# Patient Record
Sex: Female | Born: 1960 | Race: Black or African American | Hispanic: No | Marital: Single | Smoking: Current every day smoker
Health system: Southern US, Community
[De-identification: ages and names within clinical notes are randomized; demographics above are authoritative.]

## PROBLEM LIST (undated history)

## (undated) DIAGNOSIS — M549 Dorsalgia, unspecified: Secondary | ICD-10-CM

## (undated) DIAGNOSIS — G43909 Migraine, unspecified, not intractable, without status migrainosus: Secondary | ICD-10-CM

## (undated) HISTORY — PX: BACK SURGERY: SHX140

---

## 2008-08-07 ENCOUNTER — Encounter: Admission: RE | Admit: 2008-08-07 | Discharge: 2008-08-07 | Payer: Self-pay | Admitting: Obstetrics and Gynecology

## 2008-08-09 ENCOUNTER — Encounter: Admission: RE | Admit: 2008-08-09 | Discharge: 2008-08-09 | Payer: Self-pay | Admitting: Obstetrics and Gynecology

## 2008-08-29 ENCOUNTER — Encounter: Admission: RE | Admit: 2008-08-29 | Discharge: 2008-08-29 | Payer: Self-pay | Admitting: Obstetrics and Gynecology

## 2009-09-20 ENCOUNTER — Emergency Department (HOSPITAL_BASED_OUTPATIENT_CLINIC_OR_DEPARTMENT_OTHER): Admission: EM | Admit: 2009-09-20 | Discharge: 2009-09-20 | Payer: Self-pay | Admitting: Emergency Medicine

## 2009-10-18 ENCOUNTER — Ambulatory Visit: Payer: Self-pay | Admitting: Diagnostic Radiology

## 2009-10-18 ENCOUNTER — Emergency Department (HOSPITAL_BASED_OUTPATIENT_CLINIC_OR_DEPARTMENT_OTHER): Admission: EM | Admit: 2009-10-18 | Discharge: 2009-10-18 | Payer: Self-pay | Admitting: Emergency Medicine

## 2010-04-27 ENCOUNTER — Encounter: Payer: Self-pay | Admitting: Internal Medicine

## 2010-06-21 LAB — CBC
HCT: 38 % (ref 36.0–46.0)
Hemoglobin: 12.7 g/dL (ref 12.0–15.0)
MCH: 33.1 pg (ref 26.0–34.0)
MCHC: 33.5 g/dL (ref 30.0–36.0)
MCV: 98.9 fL (ref 78.0–100.0)
Platelets: 273 10*3/uL (ref 150–400)
RBC: 3.84 MIL/uL — ABNORMAL LOW (ref 3.87–5.11)
RDW: 12.6 % (ref 11.5–15.5)
WBC: 5.1 10*3/uL (ref 4.0–10.5)

## 2010-06-21 LAB — LIPASE, BLOOD: Lipase: 90 U/L (ref 23–300)

## 2010-06-21 LAB — WET PREP, GENITAL
Trich, Wet Prep: NONE SEEN
Yeast Wet Prep HPF POC: NONE SEEN

## 2010-06-21 LAB — COMPREHENSIVE METABOLIC PANEL WITH GFR
ALT: 22 U/L (ref 0–35)
AST: 21 U/L (ref 0–37)
Alkaline Phosphatase: 73 U/L (ref 39–117)
CO2: 25 meq/L (ref 19–32)
Chloride: 107 meq/L (ref 96–112)
Creatinine, Ser: 0.7 mg/dL (ref 0.4–1.2)
GFR calc Af Amer: >60 mL/min (ref >60)
GFR calc non Af Amer: >60 mL/min (ref >60)
Potassium: 4 meq/L (ref 3.5–5.1)
Total Bilirubin: 1 mg/dL (ref 0.3–1.2)

## 2010-06-21 LAB — URINALYSIS, ROUTINE W REFLEX MICROSCOPIC
Bilirubin Urine: NEGATIVE
Glucose, UA: NEGATIVE mg/dL
Hgb urine dipstick: NEGATIVE
Ketones, ur: NEGATIVE mg/dL
Nitrite: NEGATIVE
Protein, ur: NEGATIVE mg/dL
Specific Gravity, Urine: 1.029 (ref 1.005–1.030)
Urobilinogen, UA: 0.2 mg/dL (ref 0.0–1.0)
pH: 6.5 (ref 5.0–8.0)

## 2010-06-21 LAB — COMPREHENSIVE METABOLIC PANEL
Albumin: 4.5 g/dL (ref 3.5–5.2)
BUN: 14 mg/dL (ref 6–23)
Calcium: 9.6 mg/dL (ref 8.4–10.5)
Glucose, Bld: 81 mg/dL (ref 70–99)
Sodium: 143 mEq/L (ref 135–145)
Total Protein: 8.3 g/dL (ref 6.0–8.3)

## 2010-06-21 LAB — DIFFERENTIAL
Basophils Absolute: 0.1 K/uL (ref 0.0–0.1)
Basophils Relative: 2 % — ABNORMAL HIGH (ref 0–1)
Eosinophils Absolute: 0 K/uL (ref 0.0–0.7)
Eosinophils Relative: 1 % (ref 0–5)
Lymphocytes Relative: 36 % (ref 12–46)
Lymphs Abs: 1.8 10*3/uL (ref 0.7–4.0)
Monocytes Absolute: 0.4 10*3/uL (ref 0.1–1.0)
Monocytes Relative: 9 % (ref 3–12)
Neutro Abs: 2.8 10*3/uL (ref 1.7–7.7)
Neutrophils Relative %: 53 % (ref 43–77)

## 2010-06-21 LAB — PREGNANCY, URINE: Preg Test, Ur: NEGATIVE

## 2010-06-21 LAB — GC/CHLAMYDIA PROBE AMP, GENITAL
Chlamydia, DNA Probe: NEGATIVE
GC Probe Amp, Genital: NEGATIVE

## 2010-06-21 LAB — RPR: RPR Ser Ql: NONREACTIVE

## 2010-08-14 ENCOUNTER — Emergency Department (HOSPITAL_BASED_OUTPATIENT_CLINIC_OR_DEPARTMENT_OTHER)
Admission: EM | Admit: 2010-08-14 | Discharge: 2010-08-14 | Disposition: A | Discharge status: Home / Self Care | Payer: 59 | Attending: Emergency Medicine | Admitting: Emergency Medicine

## 2010-08-14 DIAGNOSIS — S335XXA Sprain of ligaments of lumbar spine, initial encounter: Secondary | ICD-10-CM | POA: Insufficient documentation

## 2010-08-14 DIAGNOSIS — X500XXA Overexertion from strenuous movement or load, initial encounter: Secondary | ICD-10-CM | POA: Insufficient documentation

## 2011-05-04 ENCOUNTER — Other Ambulatory Visit: Payer: Self-pay | Admitting: Internal Medicine

## 2011-05-04 DIAGNOSIS — Z1231 Encounter for screening mammogram for malignant neoplasm of breast: Secondary | ICD-10-CM

## 2011-05-27 ENCOUNTER — Ambulatory Visit
Admission: RE | Admit: 2011-05-27 | Discharge: 2011-05-27 | Disposition: A | Discharge status: Home / Self Care | Payer: 59 | Source: Ambulatory Visit | Attending: Internal Medicine | Admitting: Internal Medicine

## 2011-05-27 DIAGNOSIS — Z1231 Encounter for screening mammogram for malignant neoplasm of breast: Secondary | ICD-10-CM

## 2013-07-05 ENCOUNTER — Other Ambulatory Visit: Payer: Self-pay | Admitting: Family Medicine

## 2013-07-05 DIAGNOSIS — R109 Unspecified abdominal pain: Secondary | ICD-10-CM

## 2013-07-11 ENCOUNTER — Other Ambulatory Visit: Payer: 59

## 2014-10-12 ENCOUNTER — Encounter (HOSPITAL_BASED_OUTPATIENT_CLINIC_OR_DEPARTMENT_OTHER): Payer: Self-pay | Admitting: *Deleted

## 2014-10-12 ENCOUNTER — Emergency Department (HOSPITAL_BASED_OUTPATIENT_CLINIC_OR_DEPARTMENT_OTHER)
Admission: EM | Admit: 2014-10-12 | Discharge: 2014-10-12 | Disposition: A | Discharge status: Home / Self Care | Payer: Self-pay | Attending: Emergency Medicine | Admitting: Emergency Medicine

## 2014-10-12 DIAGNOSIS — M549 Dorsalgia, unspecified: Secondary | ICD-10-CM | POA: Insufficient documentation

## 2014-10-12 DIAGNOSIS — Z720 Tobacco use: Secondary | ICD-10-CM | POA: Insufficient documentation

## 2014-10-12 DIAGNOSIS — Z9889 Other specified postprocedural states: Secondary | ICD-10-CM | POA: Insufficient documentation

## 2014-10-12 DIAGNOSIS — G8929 Other chronic pain: Secondary | ICD-10-CM | POA: Insufficient documentation

## 2014-10-12 DIAGNOSIS — Z7951 Long term (current) use of inhaled steroids: Secondary | ICD-10-CM | POA: Insufficient documentation

## 2014-10-12 DIAGNOSIS — Z79899 Other long term (current) drug therapy: Secondary | ICD-10-CM | POA: Insufficient documentation

## 2014-10-12 HISTORY — DX: Dorsalgia, unspecified: M54.9

## 2014-10-12 NOTE — ED Notes (Signed)
MD at bedside. 

## 2014-10-12 NOTE — Discharge Instructions (Signed)
As we discussed unable to fill of pain medications for chronic back pain.

## 2014-10-12 NOTE — ED Notes (Signed)
Patient states she has chronic back pain and has her pain medications managed by her doctor [facility].  States she sees the doctor every two months.  Today, she found out her insurance has been cancelled and she has a large bill with her doctor's office.  States she could not afford to pay the balance today.

## 2014-10-12 NOTE — ED Provider Notes (Signed)
CSN: [id number]     Arrival date & time 10/12/14  5621 History   First MD Initiated Contact with Patient 10/12/14 [id number]     Chief Complaint  Patient presents with  . Medication Refill     (Consider location/radiation/quality/duration/timing/severity/associated sxs/prior Treatment) The history is provided by the patient.   54 year old female with a history of chronic active pain. Followed by [facility] in the past. Normally seen by them every 2 months for control of this pain. They are no longer able to follow her. Patient is here to have her chronic pain medicines refilled. No new injury or any new or worse symptoms.  Past Medical History  Diagnosis Date  . Back pain    Past Surgical History  Procedure Laterality Date  . Back surgery     No family history on file. History  Substance Use Topics  . Smoking status: Current Every Day Smoker    Types: Cigarettes  . Smokeless tobacco: Never Used  . Alcohol Use: No   OB History    No data available     Review of Systems  Constitutional: Negative for fever.  HENT: Negative for congestion.   Eyes: Negative for redness.  Respiratory: Negative for shortness of breath.   Cardiovascular: Negative for chest pain.  Gastrointestinal: Negative for nausea, vomiting and abdominal pain.  Genitourinary: Negative for dysuria.  Musculoskeletal: Positive for back pain. Negative for neck pain.  Skin: Negative for rash.  Neurological: Negative for headaches.  Hematological: Does not bruise/bleed easily.  Psychiatric/Behavioral: Negative for confusion.      Allergies  Motrin  Home Medications   Prior to Admission medications   Medication Sig Start Date End Date Taking? Authorizing Provider  fluticasone (FLOVENT DISKUS) 50 MCG/BLIST diskus inhaler Inhale 1 puff into the lungs 2 (two) times daily.   Yes Historical Provider, MD  HYDROcodone-acetaminophen (NORCO) 10-325 MG per tablet Take 1 tablet by mouth every 6 (six) hours as needed.    Yes Historical Provider, MD  Linaclotide [name]) 145 MCG CAPS capsule Take 145 mcg by mouth daily.   Yes Historical Provider, MD  zolpidem (AMBIEN) 10 MG tablet Take 10 mg by mouth at bedtime as needed for sleep.   Yes Historical Provider, MD   BP 146/73 mmHg  Pulse 88  Temp(Src) 98.9 F (37.2 C) (Oral)  Resp 18  Ht  (1.676 m)  Wt 142 lb (64.411 kg)  BMI 22.93 kg/m2  SpO2 99%  LMP 10/05/2014 (Approximate) Physical Exam  Constitutional: She is oriented to person, place, and time. She appears well-developed and well-nourished. No distress.  HENT:  Head: Normocephalic and atraumatic.  Mouth/Throat: Oropharynx is clear and moist.  Eyes: Conjunctivae and EOM are normal. Pupils are equal, round, and reactive to light.  Neck: Normal range of motion. Neck supple.  Cardiovascular: Normal rate, regular rhythm and normal heart sounds.   No murmur heard. Pulmonary/Chest: Effort normal and breath sounds normal.  Abdominal: Soft. Bowel sounds are normal. There is no tenderness.  Musculoskeletal: Normal range of motion.  Neurological: She is alert and oriented to person, place, and time. No cranial nerve deficit. She exhibits normal muscle tone. Coordination normal.  Skin: Skin is warm. No rash noted.  Nursing note and vitals reviewed.   ED Course  Procedures (including critical care time) Labs Review Labs Reviewed - No data to display  Imaging Review No results found.   EKG Interpretation None      MDM   Final diagnoses:  Chronic back  pain    [facility] Seen by them every 2 months has a history of chronic back pain. They are no longer willing to fill her pain medication. Instructed patient that were not able to provide narcotics for chronic pain problems. Offered nonnarcotic or at least one dose of pain medicine here. Patient opted the for neither of those. Recommended that perhaps urgent care could accommodate but could not guarantee  it.    Vanetta MuldersScott Jalayne Ganesh, MD 10/12/14 1057

## 2015-03-30 ENCOUNTER — Emergency Department (HOSPITAL_BASED_OUTPATIENT_CLINIC_OR_DEPARTMENT_OTHER): Payer: No Typology Code available for payment source

## 2015-03-30 ENCOUNTER — Emergency Department (HOSPITAL_BASED_OUTPATIENT_CLINIC_OR_DEPARTMENT_OTHER)
Admission: EM | Admit: 2015-03-30 | Discharge: 2015-03-30 | Disposition: A | Discharge status: Home / Self Care | Payer: No Typology Code available for payment source | Attending: Emergency Medicine | Admitting: Emergency Medicine

## 2015-03-30 ENCOUNTER — Encounter (HOSPITAL_BASED_OUTPATIENT_CLINIC_OR_DEPARTMENT_OTHER): Payer: Self-pay | Admitting: Emergency Medicine

## 2015-03-30 DIAGNOSIS — F17210 Nicotine dependence, cigarettes, uncomplicated: Secondary | ICD-10-CM | POA: Diagnosis not present

## 2015-03-30 DIAGNOSIS — M545 Low back pain, unspecified: Secondary | ICD-10-CM

## 2015-03-30 DIAGNOSIS — Y92410 Unspecified street and highway as the place of occurrence of the external cause: Secondary | ICD-10-CM | POA: Insufficient documentation

## 2015-03-30 DIAGNOSIS — S3992XA Unspecified injury of lower back, initial encounter: Secondary | ICD-10-CM | POA: Insufficient documentation

## 2015-03-30 DIAGNOSIS — G8929 Other chronic pain: Secondary | ICD-10-CM | POA: Diagnosis not present

## 2015-03-30 DIAGNOSIS — Y998 Other external cause status: Secondary | ICD-10-CM | POA: Diagnosis not present

## 2015-03-30 DIAGNOSIS — Z7951 Long term (current) use of inhaled steroids: Secondary | ICD-10-CM | POA: Diagnosis not present

## 2015-03-30 DIAGNOSIS — Z79899 Other long term (current) drug therapy: Secondary | ICD-10-CM | POA: Diagnosis not present

## 2015-03-30 DIAGNOSIS — Z9889 Other specified postprocedural states: Secondary | ICD-10-CM | POA: Insufficient documentation

## 2015-03-30 DIAGNOSIS — Y9389 Activity, other specified: Secondary | ICD-10-CM | POA: Insufficient documentation

## 2015-03-30 MED ORDER — CYCLOBENZAPRINE HCL 5 MG PO TABS: 5.0000 mg | ORAL_TABLET | Freq: Three times a day (TID) | ORAL | Status: DC | PRN
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
## 2015-04-01 ENCOUNTER — Emergency Department (HOSPITAL_BASED_OUTPATIENT_CLINIC_OR_DEPARTMENT_OTHER)
Admission: EM | Admit: 2015-04-01 | Discharge: 2015-04-01 | Disposition: A | Discharge status: Home / Self Care | Payer: Self-pay | Attending: Emergency Medicine | Admitting: Emergency Medicine

## 2015-04-01 ENCOUNTER — Encounter (HOSPITAL_BASED_OUTPATIENT_CLINIC_OR_DEPARTMENT_OTHER): Payer: Self-pay | Admitting: *Deleted

## 2015-04-01 DIAGNOSIS — Z79899 Other long term (current) drug therapy: Secondary | ICD-10-CM | POA: Insufficient documentation

## 2015-04-01 DIAGNOSIS — F17210 Nicotine dependence, cigarettes, uncomplicated: Secondary | ICD-10-CM | POA: Insufficient documentation

## 2015-04-01 DIAGNOSIS — Z7951 Long term (current) use of inhaled steroids: Secondary | ICD-10-CM | POA: Insufficient documentation

## 2015-04-01 DIAGNOSIS — R609 Edema, unspecified: Secondary | ICD-10-CM

## 2015-04-01 DIAGNOSIS — R600 Localized edema: Secondary | ICD-10-CM | POA: Insufficient documentation

## 2015-04-01 LAB — BASIC METABOLIC PANEL
Anion gap: 6 (ref 5–15)
BUN: 21 mg/dL — AB (ref 6–20)
CHLORIDE: 110 mmol/L (ref 101–111)
CO2: 23 mmol/L (ref 22–32)
CREATININE: 0.62 mg/dL (ref 0.44–1.00)
Calcium: 8.7 mg/dL — ABNORMAL LOW (ref 8.9–10.3)
GFR calc Af Amer: >60 mL/min (ref >60)
GFR calc non Af Amer: >60 mL/min (ref >60)
Glucose, Bld: 98 mg/dL (ref 65–99)
Potassium: 3.5 mmol/L (ref 3.5–5.1)
Sodium: 139 mmol/L (ref 135–145)

## 2015-04-01 MED ORDER — FUROSEMIDE 20 MG PO TABS
20.0000 mg | ORAL_TABLET | Freq: Every day | ORAL | Status: DC
Start: 2015-04-01 — End: 2015-09-16
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
## 2015-04-01 NOTE — Discharge Instructions (Signed)
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
## 2015-04-01 NOTE — ED Notes (Signed)
Painful, swollen ankles, pulses present, capillary refill good, pt has been on her feet a lot recently for the holiday.

## 2015-04-01 NOTE — ED Notes (Signed)
Pt verbalizes understanding of d/c instructions and denies any further needs at this time. 

## 2015-09-16 ENCOUNTER — Emergency Department (HOSPITAL_COMMUNITY)
Admission: EM | Admit: 2015-09-16 | Discharge: 2015-09-16 | Disposition: A | Discharge status: Home / Self Care | Payer: BLUE CROSS/BLUE SHIELD | Attending: Emergency Medicine | Admitting: Emergency Medicine

## 2015-09-16 ENCOUNTER — Emergency Department (HOSPITAL_COMMUNITY): Payer: BLUE CROSS/BLUE SHIELD

## 2015-09-16 DIAGNOSIS — F17200 Nicotine dependence, unspecified, uncomplicated: Secondary | ICD-10-CM | POA: Diagnosis not present

## 2015-09-16 DIAGNOSIS — G43809 Other migraine, not intractable, without status migrainosus: Secondary | ICD-10-CM | POA: Diagnosis not present

## 2015-09-16 DIAGNOSIS — Z79899 Other long term (current) drug therapy: Secondary | ICD-10-CM | POA: Insufficient documentation

## 2015-09-16 DIAGNOSIS — R51 Headache: Secondary | ICD-10-CM | POA: Diagnosis present

## 2015-09-16 LAB — COMPREHENSIVE METABOLIC PANEL
ALBUMIN: 4.7 g/dL (ref 3.5–5.0)
ALT: 20 U/L (ref 14–54)
AST: 20 U/L (ref 15–41)
Alkaline Phosphatase: 64 U/L (ref 38–126)
Anion gap: 9 (ref 5–15)
BUN: 12 mg/dL (ref 6–20)
CALCIUM: 9 mg/dL (ref 8.9–10.3)
CHLORIDE: 104 mmol/L (ref 101–111)
CO2: 23 mmol/L (ref 22–32)
Creatinine, Ser: 0.67 mg/dL (ref 0.44–1.00)
GFR calc Af Amer: >60 mL/min (ref >60)
GFR calc non Af Amer: >60 mL/min (ref >60)
Glucose, Bld: 106 mg/dL — ABNORMAL HIGH (ref 65–99)
Potassium: 3.6 mmol/L (ref 3.5–5.1)
SODIUM: 136 mmol/L (ref 135–145)
Total Bilirubin: 1 mg/dL (ref 0.3–1.2)
Total Protein: 8 g/dL (ref 6.5–8.1)

## 2015-09-16 LAB — CBC WITH DIFFERENTIAL/PLATELET
BASOS ABS: 0 10*3/uL (ref 0.0–0.1)
Basophils Relative: 0 %
EOS ABS: 0 10*3/uL (ref 0.0–0.7)
EOS PCT: 0 %
HCT: 37.7 % (ref 36.0–46.0)
Hemoglobin: 12.8 g/dL (ref 12.0–15.0)
LYMPHS PCT: 17 %
Lymphs Abs: 1.7 10*3/uL (ref 0.7–4.0)
MCH: 32.3 pg (ref 26.0–34.0)
MCHC: 34 g/dL (ref 30.0–36.0)
MCV: 95.2 fL (ref 78.0–100.0)
Monocytes Absolute: 0.4 10*3/uL (ref 0.1–1.0)
Monocytes Relative: 4 %
Neutro Abs: 7.9 10*3/uL — ABNORMAL HIGH (ref 1.7–7.7)
Neutrophils Relative %: 79 %
PLATELETS: 264 10*3/uL (ref 150–400)
RBC: 3.96 MIL/uL (ref 3.87–5.11)
RDW: 12.2 % (ref 11.5–15.5)
WBC: 10.1 10*3/uL (ref 4.0–10.5)

## 2015-09-16 MED ORDER — SODIUM CHLORIDE 0.9 % IV BOLUS (SEPSIS): 1000.0000 mL | Freq: Once | INTRAVENOUS | Status: DC

## 2015-09-16 MED ORDER — KETOROLAC TROMETHAMINE 15 MG/ML IJ SOLN
15.0000 mg | Freq: Once | INTRAMUSCULAR | Status: AC
  Administered 2015-09-16: 15 mg via INTRAVENOUS
  Filled 2015-09-16: qty 1

## 2015-09-16 MED ORDER — PROCHLORPERAZINE EDISYLATE 5 MG/ML IJ SOLN
10.0000 mg | Freq: Once | INTRAMUSCULAR | Status: AC
  Administered 2015-09-16: 10 mg via INTRAVENOUS
  Filled 2015-09-16: qty 2

## 2015-09-16 MED ORDER — DIPHENHYDRAMINE HCL 50 MG/ML IJ SOLN
12.5000 mg | Freq: Once | INTRAMUSCULAR | Status: AC
  Administered 2015-09-16: 12.5 mg via INTRAVENOUS
  Filled 2015-09-16: qty 1

## 2015-09-16 NOTE — ED Provider Notes (Signed)
CSN: [id number]     Arrival date & time 09/16/15  1626 History   First MD Initiated Contact with Patient 09/16/15 1710     Chief Complaint  Patient presents with  . Headache     HPI  Expand All Collapse All   Pt [place] the [place] around [place] and felt a 'pop' and pain in the R side of her head. She has now had a pain since that time in her R side of her face. Also c/o abdominal pain. Alert and oriented. Neuro intact        Past Medical History  Diagnosis Date  . Back pain    Past Surgical History  Procedure Laterality Date  . Back surgery     No family history on file. Social History  Substance Use Topics  . Smoking status: Current Every Day Smoker    Types: Cigarettes  . Smokeless tobacco: Never Used  . Alcohol Use: No   OB History    No data available     Review of Systems  Neurological: Negative for syncope, facial asymmetry, speech difficulty and numbness.  All other systems reviewed and are negative.     Allergies  Motrin  Home Medications   Prior to Admission medications   Medication Sig Start Date End Date Taking? Authorizing Provider  HYDROcodone-acetaminophen (NORCO) 10-325 MG per tablet Take 1 tablet by mouth every 6 (six) hours as needed for moderate pain.    Yes Historical Provider, MD  silver sulfADIAZINE (SILVADENE) 1 % cream Apply 1 application topically daily as needed (eczema).   Yes Historical Provider, MD   BP 143/76 mmHg  Pulse 82  Temp(Src) 99.4 F (37.4 C) (Oral)  Resp 18  SpO2 100%  LMP 08/05/2015 (Approximate) Physical Exam  Constitutional: She is oriented to person, place, and time. She appears well-developed and well-nourished. No distress.  HENT:  Head: Normocephalic and atraumatic.  Eyes: Pupils are equal, round, and reactive to light.  Neck: Normal range of motion.  Cardiovascular: Normal rate and intact distal pulses.   Pulmonary/Chest: No respiratory distress.  Abdominal: Normal appearance. She  exhibits no distension.  Musculoskeletal: Normal range of motion.  Neurological: She is alert and oriented to person, place, and time. She has normal strength. No cranial nerve deficit or sensory deficit. She displays a negative Romberg sign. GCS eye subscore is 4. GCS verbal subscore is 5. GCS motor subscore is 6.  Skin: Skin is warm and dry. No rash noted.  Psychiatric: She has a normal mood and affect. Her behavior is normal.  Nursing note and vitals reviewed.   ED Course  Procedures (including critical care time) Labs Review Labs Reviewed  CBC WITH DIFFERENTIAL/PLATELET - Abnormal; Notable for the following:    Neutro Abs 7.9 (*)    All other components within normal limits  COMPREHENSIVE METABOLIC PANEL - Abnormal; Notable for the following:    Glucose, Bld 106 (*)    All other components within normal limits    Imaging Review Ct Head Wo Contrast  09/16/2015  CLINICAL DATA:  Felt pop and pain at the right side of the head, acute onset. Initial encounter. EXAM: CT HEAD WITHOUT CONTRAST TECHNIQUE: Contiguous axial images were obtained from the base of the skull through the vertex without intravenous contrast. COMPARISON:  None. FINDINGS: There is no evidence of acute infarction, mass lesion, or intra- or extra-axial hemorrhage on CT. Mild subcortical and periventricular white matter change may reflect small vessel  ischemic microangiopathy. The posterior fossa, including the cerebellum, brainstem and fourth ventricle, is within normal limits. The third and lateral ventricles, and basal ganglia are unremarkable in appearance. The cerebral hemispheres are symmetric in appearance, with normal gray-white differentiation. No mass effect or midline shift is seen. There is no evidence of fracture; visualized osseous structures are unremarkable in appearance. The visualized portions of the orbits are within normal limits. The paranasal sinuses and mastoid air cells are well-aerated. No significant  soft tissue abnormalities are seen. IMPRESSION: 1. No acute intracranial pathology seen on CT. 2. Mild small vessel ischemic microangiopathy noted. Electronically Signed   By: Roanna RaiderJeffery  Chang M.D.   On: 09/16/2015 18:51   I have personally reviewed and evaluated these images and lab results as part of my medical decision-making.   EKG Interpretation   Date/Time:  Monday September 16 2015 17:24:01 EDT Ventricular Rate:  71 PR Interval:  163 QRS Duration: 99 QT Interval:  453 QTC Calculation: 492 R Axis:   34 Text Interpretation:  Sinus rhythm Biatrial enlargement RSR' in V1 or V2,  right VCD or RVH Borderline prolonged QT interval No significant change  since last tracing Confirmed by Tamia Dial  MD, Shawneen Deetz ([id number]) on 09/16/2015  5:27:45 PM     After treatment in the ED the patient feels back to baseline and wants to go home. MDM   Final diagnoses:  Other type of nonintractable migraine        Nelva Nayobert Lynnleigh Soden, MD 09/16/15 2002

## 2015-09-16 NOTE — ED Notes (Signed)
Pt states that she [place] the [place] this [place] around noon and felt a 'pop' and pain in the R side of her head. She has now had a pain since that time in her R side of her face. Also c/o abdominal pain. Alert and oriented. Neuro intact.

## 2015-09-16 NOTE — ED Notes (Signed)
Patient was alert, oriented and stable upon discharge. RN went over AVS and patient had no further questions.  

## 2015-09-16 NOTE — Discharge Instructions (Signed)

## 2015-09-16 NOTE — ED Notes (Signed)
Pt called x1 in lobby without answer.

## 2015-09-26 ENCOUNTER — Emergency Department (HOSPITAL_COMMUNITY): Payer: BLUE CROSS/BLUE SHIELD

## 2015-09-26 ENCOUNTER — Inpatient Hospital Stay (HOSPITAL_COMMUNITY)
Admission: EM | Admit: 2015-09-26 | Discharge: 2015-10-02 | DRG: 025 | Disposition: A | Discharge status: Home Health | Payer: BLUE CROSS/BLUE SHIELD | Attending: Neurosurgery | Admitting: Neurosurgery

## 2015-09-26 ENCOUNTER — Encounter (HOSPITAL_COMMUNITY): Payer: Self-pay

## 2015-09-26 DIAGNOSIS — F17210 Nicotine dependence, cigarettes, uncomplicated: Secondary | ICD-10-CM | POA: Diagnosis present

## 2015-09-26 DIAGNOSIS — H4901 Third [oculomotor] nerve palsy, right eye: Secondary | ICD-10-CM | POA: Diagnosis present

## 2015-09-26 DIAGNOSIS — I671 Cerebral aneurysm, nonruptured: Principal | ICD-10-CM | POA: Diagnosis present

## 2015-09-26 DIAGNOSIS — I609 Nontraumatic subarachnoid hemorrhage, unspecified: Secondary | ICD-10-CM | POA: Diagnosis present

## 2015-09-26 DIAGNOSIS — K219 Gastro-esophageal reflux disease without esophagitis: Secondary | ICD-10-CM | POA: Diagnosis present

## 2015-09-26 DIAGNOSIS — R519 Headache, unspecified: Secondary | ICD-10-CM

## 2015-09-26 DIAGNOSIS — Z452 Encounter for adjustment and management of vascular access device: Secondary | ICD-10-CM

## 2015-09-26 DIAGNOSIS — R51 Headache: Secondary | ICD-10-CM

## 2015-09-26 HISTORY — DX: Migraine, unspecified, not intractable, without status migrainosus: G43.909

## 2015-09-26 LAB — SEDIMENTATION RATE: Sed Rate: 5 mm/hr (ref 0–22)

## 2015-09-26 LAB — CBC WITH DIFFERENTIAL/PLATELET
Basophils Absolute: 0 10*3/uL (ref 0.0–0.1)
Basophils Relative: 0 %
Eosinophils Absolute: 0 10*3/uL (ref 0.0–0.7)
Eosinophils Relative: 0 %
HEMATOCRIT: 37.2 % (ref 36.0–46.0)
Hemoglobin: 12.7 g/dL (ref 12.0–15.0)
LYMPHS PCT: 20 %
Lymphs Abs: 1.5 10*3/uL (ref 0.7–4.0)
MCH: 32.6 pg (ref 26.0–34.0)
MCHC: 34.1 g/dL (ref 30.0–36.0)
MCV: 95.4 fL (ref 78.0–100.0)
MONO ABS: 0.3 10*3/uL (ref 0.1–1.0)
MONOS PCT: 5 %
NEUTROS ABS: 5.5 10*3/uL (ref 1.7–7.7)
Neutrophils Relative %: 75 %
Platelets: 285 10*3/uL (ref 150–400)
RBC: 3.9 MIL/uL (ref 3.87–5.11)
RDW: 12.4 % (ref 11.5–15.5)
WBC: 7.3 10*3/uL (ref 4.0–10.5)

## 2015-09-26 LAB — BASIC METABOLIC PANEL
Anion gap: 8 (ref 5–15)
BUN: 15 mg/dL (ref 6–20)
CALCIUM: 9.2 mg/dL (ref 8.9–10.3)
CO2: 24 mmol/L (ref 22–32)
CREATININE: 0.62 mg/dL (ref 0.44–1.00)
Chloride: 106 mmol/L (ref 101–111)
GFR calc Af Amer: >60 mL/min (ref >60)
GFR calc non Af Amer: >60 mL/min (ref >60)
GLUCOSE: 123 mg/dL — AB (ref 65–99)
Potassium: 3.6 mmol/L (ref 3.5–5.1)
Sodium: 138 mmol/L (ref 135–145)

## 2015-09-26 LAB — CBG MONITORING, ED: Glucose-Capillary: 127 mg/dL — ABNORMAL HIGH (ref 65–99)

## 2015-09-26 MED ORDER — ONDANSETRON HCL 4 MG/2ML IJ SOLN
4.0000 mg | Freq: Once | INTRAMUSCULAR | Status: AC
  Administered 2015-09-26: 4 mg via INTRAVENOUS
  Filled 2015-09-26: qty 2

## 2015-09-26 MED ORDER — MORPHINE SULFATE (PF) 4 MG/ML IV SOLN
4.0000 mg | Freq: Once | INTRAVENOUS | Status: AC
Start: 2015-09-26 — End: 2015-09-26
  Administered 2015-09-26: 4 mg via INTRAVENOUS
  Filled 2015-09-26: qty 1

## 2015-09-26 MED ORDER — GADOBENATE DIMEGLUMINE 529 MG/ML IV SOLN
15.0000 mL | Freq: Once | INTRAVENOUS | Status: AC | PRN
  Administered 2015-09-26: 13 mL via INTRAVENOUS

## 2015-09-26 MED ORDER — MORPHINE SULFATE (PF) 2 MG/ML IV SOLN
1.0000 mg | INTRAVENOUS | Status: DC | PRN
  Administered 2015-09-27 – 2015-09-28 (×15): 2 mg via INTRAVENOUS
  Administered 2015-09-29 (×3): 4 mg via INTRAVENOUS
  Administered 2015-09-29: 2 mg via INTRAVENOUS
  Administered 2015-09-29 (×2): 4 mg via INTRAVENOUS
  Administered 2015-09-30 (×5): 2 mg via INTRAVENOUS
  Administered 2015-09-30: 4 mg via INTRAVENOUS
  Administered 2015-10-01: 2 mg via INTRAVENOUS
  Administered 2015-10-01: 4 mg via INTRAVENOUS
  Filled 2015-09-26: qty 2
  Filled 2015-09-26 (×4): qty 1
  Filled 2015-09-26 (×2): qty 2
  Filled 2015-09-26 (×4): qty 1
  Filled 2015-09-26: qty 2
  Filled 2015-09-26 (×2): qty 1
  Filled 2015-09-26: qty 2
  Filled 2015-09-26: qty 1
  Filled 2015-09-26: qty 2
  Filled 2015-09-26 (×2): qty 1
  Filled 2015-09-26: qty 2
  Filled 2015-09-26 (×8): qty 1
  Filled 2015-09-26: qty 2

## 2015-09-26 NOTE — ED Notes (Signed)
Pt c/o migraine and increasing blurred vision x almost 2 weeks.  Pain score 8/10.  Pt reports taking prescribed Norco and methylprednisolone w/o relief.  [facility] and diagnosed w/ a migraine.  Pt was seen by Neurology yesterday for same, given "two shots," and started on the methylpredisolone.

## 2015-09-26 NOTE — ED Notes (Signed)
Pt to begin NPO status starting at midnight. Pt made aware.

## 2015-09-26 NOTE — ED Notes (Signed)
Pt transported to MR 

## 2015-09-26 NOTE — ED Notes (Signed)
Pt. Visual acuity: w/o corrective lenses, both eye:20/70, lt. Eye:20/70, rt. Eye:20/200 and with corrective lenses:both eyes:20/30, rt. Eye:20/50, lt.eye 20/30.

## 2015-09-26 NOTE — ED Notes (Signed)
Family at bedside. 

## 2015-09-26 NOTE — ED Notes (Signed)
RN will do blood draw while placing IV.

## 2015-09-26 NOTE — ED Provider Notes (Signed)
CSN: [id number]     Arrival date & time 09/26/15  1420 History   First MD Initiated Contact with Patient 09/26/15 1843     Chief Complaint  Patient presents with  . Migraine  . Blurred Vision     (Consider location/radiation/quality/duration/timing/severity/associated sxs/prior Treatment) HPI   55 year old female  presenting with complaints of headache. Patient reported on 6/12 patient developed acute onset of sharp stabbing pain to her right temporal region around the right eye. Sts "i felt a pop in my right temple" and has had a headache since. Headache was intense initially prompted her to come to the ED. She had a head CT scan that was negative and was subsequently treated for a migraine. She was discharge with medication which has been taken but the headaches do persist. She follow-up with "a neurologist" yesterday for her headache. Patient was given antinausea medication, Toradol, and steroids as treatment. She took the medication and reports improvement of symptoms however today her headache became worsened and now patient follow-up diplopia in her right eye which is new. She reported her headache is moderate in intensity nonradiating. No associated fever, URI symptoms, neck pain, chest pain, difficulty breathing, focal numbness or weakness. She does report having persistent dizziness with sensations of disequilibrium which has been ongoing for the past several days. She denies history of migraine headache. She is a smoker. Denies any head trauma.  Past Medical History  Diagnosis Date  . Back pain   . Migraines    Past Surgical History  Procedure Laterality Date  . Back surgery    . Cesarean section     History reviewed. No pertinent family history. Social History  Substance Use Topics  . Smoking status: Current Every Day Smoker    Types: Cigarettes  . Smokeless tobacco: Never Used  . Alcohol Use: Yes     Comment: occ   OB History    No data available     Review of  Systems  All other systems reviewed and are negative.     Allergies  Motrin  Home Medications   Prior to Admission medications   Medication Sig Start Date End Date Taking? Authorizing Provider  HYDROcodone-acetaminophen (NORCO) 10-325 MG per tablet Take 1 tablet by mouth every 6 (six) hours as needed for moderate pain.     Historical Provider, MD  silver sulfADIAZINE (SILVADENE) 1 % cream Apply 1 application topically daily as needed (eczema).    Historical Provider, MD   BP 158/82 mmHg  Pulse 66  Temp(Src) 98.3 F (36.8 C) (Oral)  Resp 16  SpO2 100%  LMP 08/05/2015 (Approximate) Physical Exam  Constitutional: She is oriented to person, place, and time. She appears well-developed and well-nourished. No distress.  Afro-American female sitting in bed appears concerned.  HENT:  Head: Atraumatic.  Right Ear: External ear normal.  Left Ear: External ear normal.  Nose: Nose normal.  Mouth/Throat: Oropharynx is clear and moist.  Eyes: Conjunctivae are normal.  Ptosis of the right eye. Right pupil is 4 mm and not reactive. Left pupil is 2 mm reactive. Disconjugated gaze No nystagmus.  Pt. Visual acuity: w/o corrective lenses, both eye:20/70, lt. Eye:20/70, rt. Eye:20/200 and with corrective lenses:both eyes:20/30, rt. Eye:20/50, lt.eye 20/30.       Neck: Normal range of motion. Neck supple.  No carotid bruits or  temporal bruit noted  Cardiovascular: Normal rate and regular rhythm.  Exam reveals no gallop and no friction rub.   No murmur heard. Pulmonary/Chest: Effort normal  and breath sounds normal.  Abdominal: Soft. Bowel sounds are normal. There is no tenderness.  Neurological: She is alert and oriented to person, place, and time.  Neurologic exam:  Speech clear, pupils 4mm non reactive on R, 2mm and reactive on left.  Disconjugated gaze, R eye is down and out deviation.  Normal peripheral visual fields Cranial nerves III through XII normal, but ptosis to R  eyelid. Follows commands, moves all extremities x4, normal strength to bilateral upper and lower extremities at all major muscle groups including grip Sensation normal to light touch and pinprick Coordination intact, no limb ataxia, finger-nose-finger normal Rapid alternating movements normal No pronator drift Gait normal   Skin: No rash noted.  Psychiatric: She has a normal mood and affect.  Nursing note and vitals reviewed.   ED Course  Procedures (including critical care time) Labs Review Labs Reviewed  BASIC METABOLIC PANEL - Abnormal; Notable for the following:    Glucose, Bld 123 (*)    All other components within normal limits  CBG MONITORING, ED - Abnormal; Notable for the following:    Glucose-Capillary 127 (*)    All other components within normal limits  CBC WITH DIFFERENTIAL/PLATELET  SEDIMENTATION RATE  C-REACTIVE PROTEIN    Imaging Review Mr Shirlee Latch Wo Contrast  09/26/2015  CLINICAL DATA:  55 year old female with increasing headache and blurred vision for 2 weeks. Symptoms not resolved with medical therapy. Initial encounter. EXAM: MRI HEAD WITHOUT AND WITH CONTRAST MRA HEAD WITHOUT CONTRAST TECHNIQUE: Multiplanar, multiecho pulse sequences of the brain and surrounding structures were obtained without and with intravenous contrast. Angiographic images of the head were obtained using MRA technique without contrast. CONTRAST:  13mL MULTIHANCE GADOBENATE DIMEGLUMINE 529 MG/ML IV SOLN COMPARISON:  Head CT without contrast 09/16/2015. FINDINGS: MRI HEAD FINDINGS There is a thin rim of subdural hematoma present along the ventral posterior fossa, tracking from the level of the dorsal sella to the cisterna magna and maximal at the pre medullary cistern. On axial images this is mostly present along the dorsal clivus, but there is some marginal involvement of the IAC's, more so the left (series 10, image 12). There is also a small left lateral component in the posterior fossa  adjacent to the cerebellar hemisphere (series 10, image 10). No other intracranial hemorrhage or extra-axial collection identified. No findings suggestive of subarachnoid hemorrhage, although noncontrast head CT might be more sensitive. Following contrast, no abnormal enhancement identified. Major dural venous sinuses appear patent on the basis of preserved flow voids and preserved postcontrast enhancement. Other Major intracranial vascular flow voids are preserved. No restricted diffusion or evidence of acute infarction. No ventriculomegaly. No intraventricular debris. No midline shift, mass effect, or discrete mass lesion. Cervicomedullary junction and pituitary are within normal limits. Mild for age nonspecific scattered small foci of cerebral white matter T2 and FLAIR hyperintensity in both hemispheres. No cortical encephalomalacia. No chronic cerebral blood products identified. Normal signal in the deep gray matter nuclei, brainstem, and cerebellum. Go Negative visualized cervical spine. Paranasal sinuses and mastoids are clear. Negative orbit and scalp soft tissues. MRA HEAD FINDINGS Antegrade flow in the posterior circulation. Distal vertebral artery detail mildly obscured by the posterior fossa subdural blood, but appear normal. Dominant appearing left AICA appears normal. No basilar artery abnormality identified. SCA and left PCA origins are normal. Left PCA branches are normal. The left posterior communicating artery is diminutive or absent. 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 The aneurysm is directed posteriorly and inferiorly from the right ICA terminus and encompasses 6 x 7 x 9 mm (AP by transverse by CC). Preserved flow in the right posterior communicating artery and right PCA. Right PCA branches appear normal. Antegrade flow  in both ICA siphons. Proximal to the terminus, the right ICA siphon appears normal. Normal right ophthalmic artery origin. Left ICA siphon is normal. Normal left ophthalmic artery origin. Patent carotid termini. MCA and ACA origins are normal. Diminutive or absent anterior communicating artery. Bilateral ACA branches are within normal limits. There is somewhat early MCA M1 branching bilaterally, more so on the right. Visualized bilateral MCA branches are within normal limits. IMPRESSION: 1. 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 This critical value was discussed by telephone with Dr. Rubin PayorPICKERING, Harrold DonathNATHAN on 09/26/2015 at 2148 hours. He advised that the patient also has a new right third cranial nerve palsy, concordant with this abnormality. 2. No subarachnoid hemorrhage identified. No other intracranial aneurysm identified. 3. No other acute findings. Mild to moderate for age nonspecific cerebral white matter signal changes. Electronically Signed   By: Odessa FlemingH  Hall M.D.   On: 09/26/2015 21:56   Mr Laqueta JeanBrain W ZOWo Contrast  09/26/2015  CLINICAL DATA:  55 year old female with increasing headache and blurred vision for 2 weeks. Symptoms not resolved with medical therapy. Initial encounter. EXAM: MRI HEAD WITHOUT AND WITH CONTRAST MRA HEAD WITHOUT CONTRAST TECHNIQUE: Multiplanar, multiecho pulse sequences of the brain and surrounding structures were obtained without and with intravenous contrast. Angiographic images of the head were obtained using MRA technique without contrast. CONTRAST:  13mL MULTIHANCE GADOBENATE DIMEGLUMINE 529 MG/ML IV SOLN COMPARISON:  Head CT without contrast 09/16/2015. FINDINGS: MRI HEAD FINDINGS There is a thin rim of subdural hematoma present along the ventral posterior fossa, tracking from the level of the dorsal sella to the cisterna magna and maximal at the pre  medullary cistern. On axial images this is mostly present along the dorsal clivus, but there is some marginal involvement of the IAC's, more so the left (series 10, image 12). There is also a small left lateral component in the posterior fossa adjacent to the cerebellar hemisphere (series 10, image 10). No other intracranial hemorrhage or extra-axial collection identified. No findings suggestive of subarachnoid hemorrhage, although noncontrast head CT might be more sensitive. Following contrast, no abnormal enhancement identified. Major dural venous sinuses appear patent on the basis of preserved flow voids and preserved postcontrast enhancement. Other Major intracranial vascular flow voids are preserved. No restricted diffusion or evidence of acute infarction. No ventriculomegaly. No intraventricular debris. No midline shift, mass effect, or discrete mass lesion. Cervicomedullary junction and pituitary are within normal limits. Mild for age nonspecific scattered small foci of cerebral white matter T2 and FLAIR hyperintensity in both hemispheres. No cortical encephalomalacia. No chronic cerebral blood products identified. Normal signal in the deep gray matter nuclei, brainstem, and cerebellum. Go Negative visualized cervical spine. Paranasal sinuses and mastoids are clear. Negative orbit and scalp soft tissues. MRA HEAD FINDINGS Antegrade flow in the posterior circulation. Distal vertebral artery detail mildly obscured by the posterior fossa subdural blood, but appear normal. Dominant appearing left AICA appears normal. No basilar artery abnormality identified. SCA and left PCA origins are normal. Left PCA branches are normal. The left posterior  communicating artery is diminutive or absent. 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 The aneurysm  is directed posteriorly and inferiorly from the right ICA terminus and encompasses 6 x 7 x 9 mm (AP by transverse by CC). Preserved flow in the right posterior communicating artery and right PCA. Right PCA branches appear normal. Antegrade flow in both ICA siphons. Proximal to the terminus, the right ICA siphon appears normal. Normal right ophthalmic artery origin. Left ICA siphon is normal. Normal left ophthalmic artery origin. Patent carotid termini. MCA and ACA origins are normal. Diminutive or absent anterior communicating artery. Bilateral ACA branches are within normal limits. There is somewhat early MCA M1 branching bilaterally, more so on the right. Visualized bilateral MCA branches are within normal limits. IMPRESSION: 1. 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 This critical value was discussed by telephone with Dr. Rubin PayorPICKERING, Harrold DonathNATHAN on 09/26/2015 at 2148 hours. He advised that the patient also has a new right third cranial nerve palsy, concordant with this abnormality. 2. No subarachnoid hemorrhage identified. No other intracranial aneurysm identified. 3. No other acute findings. Mild to moderate for age nonspecific cerebral white matter signal changes. Electronically Signed   By: Odessa FlemingH  Hall M.D.   On: 09/26/2015 21:56   I have personally reviewed and evaluated these images and lab results as part of my medical decision-making.   EKG Interpretation None      MDM   Final diagnoses:  Headache  Subarachnoid bleed (HCC)    BP 165/82 mmHg  Pulse 62  Temp(Src) 98.5 F (36.9 C) (Oral)  Resp 15  SpO2 98%  LMP 08/05/2015 (Approximate)   7:27 PM Pt here with R temporal headache for 10 days, now with diplopia, disconjugated gaze, ptosis of R eyelid and miosis on the R eye.  The finding is concerning for CN III palsy.  Plan to obtain brain MRI/MRA along with CRP  and Sed Rate.  Work up initiated.  Care discussed with Dr. Rubin PayorPickering.    10:06 PM Patient with acute cranial nerve III palsy with ipsilateral pupil dilation and an MRI showing evidence of 9mm PCOM aneurysm with suspected bleed to subdural base of the posterior fossa.  Finding were notified to Dr. Rubin PayorPickering from our radiologist.  Pt made aware of finding.  Will consult neurosurgeon for further management.  Pt made NPO.  Last meal 3 hrs ago.    10:33 PM Neurosurgeon Dr. Yetta BarreJones has been consulted by Dr. Rubin PayorPickering.  He will review MRI image and will see pt in the ER for further management.    11:12 PM Dr. Yetta BarreJones has seen and evaluated pt and [facility] with plan of R pterional craniotomy for clipping of aneurysm tomorrow.  Pt is stable [facility]  .CRITICAL CARE Performed by: Fayrene HelperRAN,Husna Krone Total critical care time: 45 minutes Critical care time was exclusive of separately billable procedures and treating other patients. Critical care was necessary to treat or prevent imminent or life-threatening deterioration. Critical care was time spent personally by me on the following activities: development of treatment plan with patient and/or surrogate as well as nursing, discussions with consultants, evaluation of patient's response to treatment, examination of patient, obtaining history from  patient or surrogate, ordering and performing treatments and interventions, ordering and review of laboratory studies, ordering and review of radiographic studies, pulse oximetry and re-evaluation of patient's condition.   Fayrene Helper, PA-C 09/26/15 2357  Benjiman Core, MD 09/28/15 [phone]

## 2015-09-26 NOTE — ED Notes (Signed)
Pt returned from MRI °

## 2015-09-26 NOTE — ED Notes (Signed)
MD at bedside. 

## 2015-09-26 NOTE — ED Notes (Signed)
Neurosurgeon at bedside °

## 2015-09-26 NOTE — H&P (Signed)
Subjective:   Patient is a 55 y.o. female seen regarding An acute right third nerve palsy. The patient first presented with complaints of headache about 10 or 12 days ago. CT scan at that time was negative for subarachnoid hemorrhage. She has had headache since that time. Onset of symptoms was several days ago, unchanged since that time. Onset was not related to no known injury. The pain is described as aching and occurs all day. The pain is rated moderate.  Symptoms are exacerbated by activity, and are relieved by none. History positive for onset of third nerve palsy beginning today with double vision. Previous work up includes MRI MRA of the brain which showed a right P, aneurysm and blood along the clivus.  Past Medical History  Diagnosis Date  . Back pain   . Migraines     Past Surgical History  Procedure Laterality Date  . Back surgery    . Cesarean section      Allergies  Allergen Reactions  . Motrin [Ibuprofen] Nausea And Vomiting    Social History  Substance Use Topics  . Smoking status: Current Every Day Smoker    Types: Cigarettes  . Smokeless tobacco: Never Used  . Alcohol Use: Yes     Comment: occ    History reviewed. No pertinent family history. Prior to Admission medications   Medication Sig Start Date End Date Taking? Authorizing Provider  HYDROcodone-acetaminophen (NORCO) 10-325 MG per tablet Take 1 tablet by mouth every 6 (six) hours as needed for moderate pain.    Yes Historical Provider, MD  methylPREDNISolone (MEDROL DOSEPAK) 4 MG TBPK tablet as directed. 09/25/15  Yes Historical Provider, MD  silver sulfADIAZINE (SILVADENE) 1 % cream Apply 1 application topically daily as needed (eczema).   Yes Historical Provider, MD     Review of Systems  Positive ROS: Negative  All other systems have been reviewed and were otherwise negative with the exception of those mentioned in the HPI and as above.  Objective: Vital signs in last 24 hours: Temp:  [98.3 F (36.8  C)-98.5 F (36.9 C)] 98.5 F (36.9 C) (06/22 1941) Pulse Rate:  [58-75] 58 (06/22 2236) Resp:  [16-20] 20 (06/22 2236) BP: (140-163)/(73-84) 163/74 mmHg (06/22 2236) SpO2:  [97 %-100 %] 98 % (06/22 2236) Weight:  [65.772 kg (145 lb)] 65.772 kg (145 lb) (06/22 2052)  General Appearance: Alert, cooperative, no distress, appears stated age Head: Normocephalic, without obvious abnormality, atraumatic Eyes: Right pupil slightly dilated but somewhat reactive with down and out deviation of the eye     Throat: Lips, mucosa, and tongue normal; teeth and gums normal Neck: Supple, symmetrical, trachea midline Back: Symmetric, no curvature, ROM normal, no CVA tenderness Lungs:  respirations unlabored Heart: Regular rate and rhythm Abdomen: Soft,  NEUROLOGIC:   Mental status: A&O x4, no aphasia, good AS, fund of knowledge and memory Motor Exam - grossly normal Sensory Exam - grossly normal Reflexes: 1+ Coordination - grossly normal Gait - not tested Balance - not tested Cranial Nerves: I: smell Not tested  II: visual acuity  OS: na    OD: na  II: visual fields Full to confrontation  II: pupils Slightly dilated on right   III,VII: ptosis Present on right   III,IV,VI: extraocular muscles  Full ROMExcept for right eye which has down and out deviation   V: mastication Normal  V: facial light touch sensation  Normal  V,VII: corneal reflex  Present  VII: facial muscle function - upper  Normal  VII: facial muscle function - lower Normal  VIII: hearing Not tested  IX: soft palate elevation  Normal  IX,X: gag reflex Present  XI: trapezius strength  5/5  XI: sternocleidomastoid strength 5/5  XI: neck flexion strength  5/5  XII: tongue strength  Normal    Data Review Lab Results  Component Value Date   WBC 7.3 09/26/2015   HGB 12.7 09/26/2015   HCT 37.2 09/26/2015   MCV 95.4 09/26/2015   PLT 285 09/26/2015   Lab Results  Component Value Date   NA 138 09/26/2015   K 3.6  09/26/2015   CL 106 09/26/2015   CO2 24 09/26/2015   BUN 15 09/26/2015   CREATININE 0.62 09/26/2015   GLUCOSE 123* 09/26/2015   No results found for: INR, PROTIME  Assessment/Plan: 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 I have spoken with Dr. Conchita ParisNundKumar, I vascular neurosurgeon, who will admit the patient and plan on right pterional craniotomy for clipping of aneurysm tomorrow. I have explained all of this to the patient and her family. I have answered all of their questions. [facility].   JONES,DAVID S 09/26/2015 11:07 PM

## 2015-09-27 ENCOUNTER — Encounter (HOSPITAL_COMMUNITY)
Admission: EM | Disposition: A | Discharge status: Home Health | Payer: Self-pay | Source: Home / Self Care | Attending: Neurosurgery

## 2015-09-27 ENCOUNTER — Inpatient Hospital Stay (HOSPITAL_COMMUNITY): Payer: BLUE CROSS/BLUE SHIELD | Admitting: Certified Registered Nurse Anesthetist

## 2015-09-27 ENCOUNTER — Inpatient Hospital Stay (HOSPITAL_COMMUNITY): Payer: BLUE CROSS/BLUE SHIELD

## 2015-09-27 ENCOUNTER — Encounter (HOSPITAL_COMMUNITY): Payer: Self-pay | Admitting: *Deleted

## 2015-09-27 HISTORY — PX: CRANIOTOMY: SHX93

## 2015-09-27 HISTORY — PX: IR GENERIC HISTORICAL: IMG1180011

## 2015-09-27 LAB — MRSA PCR SCREENING: MRSA BY PCR: NEGATIVE

## 2015-09-27 LAB — ABO/RH: ABO/RH(D): B POS

## 2015-09-27 LAB — C-REACTIVE PROTEIN: CRP: <0.5 mg/dL (ref <1.0)

## 2015-09-27 LAB — PREPARE RBC (CROSSMATCH)

## 2015-09-27 SURGERY — RADIOLOGY WITH ANESTHESIA
Anesthesia: General

## 2015-09-27 SURGERY — CRANIOTOMY INTRACRANIAL ANEURYSM FOR CAROTID
Anesthesia: General | Laterality: Right

## 2015-09-27 MED ORDER — NIMODIPINE 30 MG PO CAPS
60.0000 mg | ORAL_CAPSULE | ORAL | Status: DC
  Administered 2015-09-27 – 2015-10-02 (×32): 60 mg via ORAL
  Filled 2015-09-27 (×33): qty 2

## 2015-09-27 MED ORDER — LACTATED RINGERS IV SOLN: INTRAVENOUS | Status: DC | PRN

## 2015-09-27 MED ORDER — THROMBIN 20000 UNITS EX SOLR
CUTANEOUS | Status: DC | PRN
  Administered 2015-09-27: 18:00:00 via TOPICAL

## 2015-09-27 MED ORDER — LIDOCAINE HCL 1 % IJ SOLN
INTRAMUSCULAR | Status: AC
  Filled 2015-09-27: qty 20

## 2015-09-27 MED ORDER — FENTANYL CITRATE (PF) 250 MCG/5ML IJ SOLN
INTRAMUSCULAR | Status: AC
  Filled 2015-09-27: qty 5

## 2015-09-27 MED ORDER — BACITRACIN ZINC 500 UNIT/GM EX OINT
TOPICAL_OINTMENT | CUTANEOUS | Status: DC | PRN
  Administered 2015-09-27: 1 via TOPICAL

## 2015-09-27 MED ORDER — CEFAZOLIN SODIUM 1 G IJ SOLR
INTRAMUSCULAR | Status: AC
  Filled 2015-09-27: qty 20

## 2015-09-27 MED ORDER — INDOCYANINE GREEN 25 MG IV SOLR
5.0000 mg | Freq: Once | INTRAVENOUS | Status: DC
  Filled 2015-09-27: qty 25

## 2015-09-27 MED ORDER — ROCURONIUM BROMIDE 50 MG/5ML IV SOLN
INTRAVENOUS | Status: AC
  Filled 2015-09-27: qty 1

## 2015-09-27 MED ORDER — FENTANYL CITRATE (PF) 100 MCG/2ML IJ SOLN
INTRAMUSCULAR | Status: AC
  Filled 2015-09-27: qty 2

## 2015-09-27 MED ORDER — LIDOCAINE HCL (PF) 1 % IJ SOLN
INTRAMUSCULAR | Status: DC | PRN
  Administered 2015-09-27: 11 mL

## 2015-09-27 MED ORDER — IOPAMIDOL (ISOVUE-300) INJECTION 61%
INTRAVENOUS | Status: AC
  Administered 2015-09-27: 21 mL
  Filled 2015-09-27: qty 100

## 2015-09-27 MED ORDER — BUPIVACAINE HCL 0.5 % IJ SOLN
INTRAMUSCULAR | Status: DC | PRN
  Administered 2015-09-27: 11 mL

## 2015-09-27 MED ORDER — ACETAMINOPHEN-CODEINE #3 300-30 MG PO TABS
1.0000 | ORAL_TABLET | ORAL | Status: DC | PRN
  Administered 2015-09-28 – 2015-10-02 (×25): 2 via ORAL
  Filled 2015-09-27 (×25): qty 2

## 2015-09-27 MED ORDER — THROMBIN 5000 UNITS EX SOLR
OROMUCOSAL | Status: DC | PRN
  Administered 2015-09-27: 18:00:00 via TOPICAL

## 2015-09-27 MED ORDER — ARTIFICIAL TEARS OP OINT
TOPICAL_OINTMENT | OPHTHALMIC | Status: AC
  Filled 2015-09-27: qty 3.5

## 2015-09-27 MED ORDER — LIDOCAINE HCL (CARDIAC) 20 MG/ML IV SOLN
INTRAVENOUS | Status: DC | PRN
  Administered 2015-09-27: 100 mg via INTRAVENOUS

## 2015-09-27 MED ORDER — GLYCOPYRROLATE 0.2 MG/ML IJ SOLN
INTRAMUSCULAR | Status: DC | PRN
  Administered 2015-09-27 (×2): 0.2 mg via INTRAVENOUS

## 2015-09-27 MED ORDER — HEPARIN SODIUM (PORCINE) 1000 UNIT/ML IJ SOLN
INTRAMUSCULAR | Status: AC
  Filled 2015-09-27: qty 2

## 2015-09-27 MED ORDER — PROPOFOL 10 MG/ML IV BOLUS
INTRAVENOUS | Status: DC | PRN
  Administered 2015-09-27: 150 mg via INTRAVENOUS
  Administered 2015-09-27: 50 mg via INTRAVENOUS

## 2015-09-27 MED ORDER — BACITRACIN 50000 UNITS IM SOLR
INTRAMUSCULAR | Status: DC | PRN
  Administered 2015-09-27: 18:00:00

## 2015-09-27 MED ORDER — SODIUM CHLORIDE 0.9 % IV SOLN
INTRAVENOUS | Status: DC | PRN
  Administered 2015-09-27 (×2): via INTRAVENOUS

## 2015-09-27 MED ORDER — IOPAMIDOL (ISOVUE-300) INJECTION 61%
INTRAVENOUS | Status: AC
  Administered 2015-09-27: 30 mL
  Filled 2015-09-27: qty 150

## 2015-09-27 MED ORDER — PROMETHAZINE HCL 25 MG/ML IJ SOLN: 6.2500 mg | INTRAMUSCULAR | Status: DC | PRN

## 2015-09-27 MED ORDER — MIDAZOLAM HCL 2 MG/2ML IJ SOLN
INTRAMUSCULAR | Status: AC
  Administered 2015-09-27 (×2): 2 mg via INTRAVENOUS
  Filled 2015-09-27: qty 2

## 2015-09-27 MED ORDER — LABETALOL HCL 5 MG/ML IV SOLN: 5.0000 mg | INTRAVENOUS | Status: DC | PRN

## 2015-09-27 MED ORDER — PHENYLEPHRINE 40 MCG/ML (10ML) SYRINGE FOR IV PUSH (FOR BLOOD PRESSURE SUPPORT)
PREFILLED_SYRINGE | INTRAVENOUS | Status: AC
  Filled 2015-09-27: qty 10

## 2015-09-27 MED ORDER — MIDAZOLAM HCL 2 MG/2ML IJ SOLN
INTRAMUSCULAR | Status: AC
  Filled 2015-09-27: qty 2

## 2015-09-27 MED ORDER — LABETALOL HCL 5 MG/ML IV SOLN
INTRAVENOUS | Status: AC
  Filled 2015-09-27: qty 4

## 2015-09-27 MED ORDER — MEPERIDINE HCL 25 MG/ML IJ SOLN: 6.2500 mg | INTRAMUSCULAR | Status: DC | PRN

## 2015-09-27 MED ORDER — ROCURONIUM BROMIDE 100 MG/10ML IV SOLN
INTRAVENOUS | Status: DC | PRN
  Administered 2015-09-27: 40 mg via INTRAVENOUS
  Administered 2015-09-27 (×2): 50 mg via INTRAVENOUS

## 2015-09-27 MED ORDER — LABETALOL HCL 5 MG/ML IV SOLN
INTRAVENOUS | Status: DC | PRN
  Administered 2015-09-27: 10 mg via INTRAVENOUS

## 2015-09-27 MED ORDER — FENTANYL CITRATE (PF) 100 MCG/2ML IJ SOLN
25.0000 ug | INTRAMUSCULAR | Status: DC | PRN
  Administered 2015-09-27 (×2): 50 ug via INTRAVENOUS

## 2015-09-27 MED ORDER — ONDANSETRON HCL 4 MG/2ML IJ SOLN
4.0000 mg | Freq: Four times a day (QID) | INTRAMUSCULAR | Status: DC | PRN
  Filled 2015-09-27: qty 2

## 2015-09-27 MED ORDER — SODIUM CHLORIDE 0.9 % IV SOLN
INTRAVENOUS | Status: DC
  Administered 2015-09-27 – 2015-09-29 (×5): via INTRAVENOUS

## 2015-09-27 MED ORDER — SENNOSIDES-DOCUSATE SODIUM 8.6-50 MG PO TABS
1.0000 | ORAL_TABLET | Freq: Two times a day (BID) | ORAL | Status: DC
  Administered 2015-09-28 – 2015-10-02 (×9): 1 via ORAL
  Filled 2015-09-27 (×9): qty 1

## 2015-09-27 MED ORDER — DEXTROSE 5 % IV SOLN
10.0000 mg | INTRAVENOUS | Status: DC | PRN
  Administered 2015-09-27: 20 ug/min via INTRAVENOUS

## 2015-09-27 MED ORDER — ROCURONIUM BROMIDE 50 MG/5ML IV SOLN
INTRAVENOUS | Status: AC
  Filled 2015-09-27: qty 4

## 2015-09-27 MED ORDER — FENTANYL CITRATE (PF) 250 MCG/5ML IJ SOLN
INTRAMUSCULAR | Status: DC | PRN
  Administered 2015-09-27 (×2): 50 ug via INTRAVENOUS
  Administered 2015-09-27: 100 ug via INTRAVENOUS
  Administered 2015-09-27 (×3): 50 ug via INTRAVENOUS
  Administered 2015-09-27: 100 ug via INTRAVENOUS
  Administered 2015-09-27 (×3): 50 ug via INTRAVENOUS
  Administered 2015-09-27: 100 ug via INTRAVENOUS

## 2015-09-27 MED ORDER — SODIUM CHLORIDE 0.9 % IV SOLN: Freq: Once | INTRAVENOUS | Status: DC

## 2015-09-27 MED ORDER — ACETAMINOPHEN 325 MG PO TABS: 650.0000 mg | ORAL_TABLET | ORAL | Status: DC | PRN

## 2015-09-27 MED ORDER — 0.9 % SODIUM CHLORIDE (POUR BTL) OPTIME
TOPICAL | Status: DC | PRN
  Administered 2015-09-27 (×2): 1000 mL

## 2015-09-27 MED ORDER — PANTOPRAZOLE SODIUM 40 MG PO TBEC
40.0000 mg | DELAYED_RELEASE_TABLET | Freq: Every day | ORAL | Status: DC
  Administered 2015-09-28 – 2015-10-02 (×5): 40 mg via ORAL
  Filled 2015-09-27 (×6): qty 1

## 2015-09-27 MED ORDER — MIDAZOLAM HCL 2 MG/2ML IJ SOLN: 0.5000 mg | Freq: Once | INTRAMUSCULAR | Status: DC | PRN

## 2015-09-27 MED ORDER — ACETAMINOPHEN 650 MG RE SUPP: 650.0000 mg | RECTAL | Status: DC | PRN

## 2015-09-27 MED ORDER — DEXAMETHASONE SODIUM PHOSPHATE 4 MG/ML IJ SOLN
INTRAMUSCULAR | Status: DC | PRN
  Administered 2015-09-27: 10 mg via INTRAVENOUS

## 2015-09-27 MED ORDER — SODIUM CHLORIDE 0.9 % IV SOLN
1000.0000 mg | INTRAVENOUS | Status: AC
  Administered 2015-09-27: 1000 mg via INTRAVENOUS
  Filled 2015-09-27: qty 10

## 2015-09-27 MED ORDER — MANNITOL 25 % IV SOLN
INTRAVENOUS | Status: DC | PRN
Start: 2015-09-27 — End: 2015-09-27
  Administered 2015-09-27: 25 g via INTRAVENOUS

## 2015-09-27 MED ORDER — NIMODIPINE 60 MG/20ML PO SOLN
60.0000 mg | ORAL | Status: DC
  Filled 2015-09-27 (×13): qty 20

## 2015-09-27 MED ORDER — PROPOFOL 10 MG/ML IV BOLUS
INTRAVENOUS | Status: AC
  Filled 2015-09-27: qty 20

## 2015-09-27 MED ORDER — SODIUM CHLORIDE 0.9 % IV SOLN
INTRAVENOUS | Status: DC | PRN
  Administered 2015-09-27 (×2): via INTRAVENOUS

## 2015-09-27 MED ORDER — ONDANSETRON HCL 4 MG/2ML IJ SOLN
INTRAMUSCULAR | Status: AC
  Filled 2015-09-27: qty 2

## 2015-09-27 MED ORDER — SODIUM CHLORIDE 0.9 % IJ SOLN
INTRAMUSCULAR | Status: AC
  Filled 2015-09-27: qty 10

## 2015-09-27 MED ORDER — STROKE: EARLY STAGES OF RECOVERY BOOK
Freq: Once | Status: AC
  Administered 2015-09-27: 05:00:00
  Filled 2015-09-27: qty 1

## 2015-09-27 MED ORDER — SODIUM CHLORIDE 0.9 % IV SOLN
500.0000 mg | Freq: Two times a day (BID) | INTRAVENOUS | Status: DC
  Administered 2015-09-27 – 2015-10-01 (×9): 500 mg via INTRAVENOUS
  Filled 2015-09-27 (×11): qty 5

## 2015-09-27 MED ORDER — CEFAZOLIN SODIUM 1 G IJ SOLR
INTRAMUSCULAR | Status: DC | PRN
  Administered 2015-09-27: 2 g via INTRAMUSCULAR

## 2015-09-27 SURGICAL SUPPLY — 106 items
BANDAGE GAUZE 4  KLING STR (GAUZE/BANDAGES/DRESSINGS) ×3 IMPLANT
BATTERY IQ STERILE (MISCELLANEOUS) ×3 IMPLANT
BENZOIN TINCTURE PRP APPL 2/3 (GAUZE/BANDAGES/DRESSINGS) IMPLANT
BIT DRILL WIRE PASS 1.3MM (BIT) IMPLANT
BLADE SAW GIGLI 16 STRL (MISCELLANEOUS) IMPLANT
BLADE SURG 15 STRL LF DISP TIS (BLADE) ×1 IMPLANT
BLADE SURG 15 STRL SS (BLADE) ×2
BLADE ULTRA TIP 2M (BLADE) IMPLANT
BNDG GAUZE ELAST 4 BULKY (GAUZE/BANDAGES/DRESSINGS) ×3 IMPLANT
BRUSH SCRUB EZ 1% IODOPHOR (MISCELLANEOUS) ×3 IMPLANT
BRUSH SCRUB EZ PLAIN DRY (MISCELLANEOUS) ×3 IMPLANT
BUR ACORN 6.0 PRECISION (BURR) ×2 IMPLANT
BUR ACORN 6.0MM PRECISION (BURR) ×1
BUR ADDG 1.1 (BURR) IMPLANT
BUR ADDG 1.1MM (BURR)
BUR MATCHSTICK NEURO 3.0 LAGG (BURR) IMPLANT
BUR ROUND FLUTED 4 SOFT TCH (BURR) ×2 IMPLANT
BUR ROUND FLUTED 4MM SOFT TCH (BURR) ×1
BUR SPIRAL ROUTER 2.3 (BUR) ×2 IMPLANT
BUR SPIRAL ROUTER 2.3MM (BUR) ×1
CANISTER SUCT 3000ML PPV (MISCELLANEOUS) ×3 IMPLANT
CLIP ANEURY TI PERM STD CVD 8M (Clip) ×3 IMPLANT
CLIP ANEURY TI PERM STD STR 11 (Clip) ×3 IMPLANT
CLIP ANEURY TI PERM STD STR 9M (Clip) ×3 IMPLANT
CLIP TI MEDIUM 6 (CLIP) IMPLANT
CORDS BIPOLAR (ELECTRODE) IMPLANT
COVER BACK TABLE 60X90IN (DRAPES) ×3 IMPLANT
COVER MAYO STAND STRL (DRAPES) IMPLANT
DECANTER SPIKE VIAL GLASS SM (MISCELLANEOUS) ×3 IMPLANT
DRAIN SNY WOU 7FLT (WOUND CARE) IMPLANT
DRAPE MICROSCOPE LEICA (MISCELLANEOUS) ×3 IMPLANT
DRAPE NEUROLOGICAL W/INCISE (DRAPES) ×3 IMPLANT
DRAPE PROXIMA HALF (DRAPES) ×6 IMPLANT
DRAPE WARM FLUID 44X44 (DRAPE) ×3 IMPLANT
DRILL WIRE PASS 1.3MM (BIT)
DRSG ADAPTIC 3X8 NADH LF (GAUZE/BANDAGES/DRESSINGS) ×3 IMPLANT
DRSG TELFA 3X8 NADH (GAUZE/BANDAGES/DRESSINGS) ×3 IMPLANT
DURAMATRIX ONLAY 3X3 (Plate) ×3 IMPLANT
DURAPREP 6ML APPLICATOR 50/CS (WOUND CARE) ×3 IMPLANT
ELECT REM PT RETURN 9FT ADLT (ELECTROSURGICAL) ×3
ELECTRODE REM PT RTRN 9FT ADLT (ELECTROSURGICAL) ×1 IMPLANT
EVACUATOR SILICONE 100CC (DRAIN) IMPLANT
FORCEPS BIPOLAR SPETZLER 8 1.0 (NEUROSURGERY SUPPLIES) ×3 IMPLANT
GAUZE SPONGE 4X4 12PLY STRL (GAUZE/BANDAGES/DRESSINGS) ×3 IMPLANT
GAUZE SPONGE 4X4 16PLY XRAY LF (GAUZE/BANDAGES/DRESSINGS) IMPLANT
GLOVE BIOGEL PI IND STRL 7.0 (GLOVE) ×1 IMPLANT
GLOVE BIOGEL PI IND STRL 7.5 (GLOVE) ×2 IMPLANT
GLOVE BIOGEL PI INDICATOR 7.0 (GLOVE) ×2
GLOVE BIOGEL PI INDICATOR 7.5 (GLOVE) ×4
GLOVE ECLIPSE 6.5 STRL STRAW (GLOVE) ×3 IMPLANT
GLOVE ECLIPSE 7.0 STRL STRAW (GLOVE) ×6 IMPLANT
GLOVE EXAM NITRILE LRG STRL (GLOVE) IMPLANT
GLOVE EXAM NITRILE MD LF STRL (GLOVE) IMPLANT
GLOVE EXAM NITRILE XL STR (GLOVE) IMPLANT
GLOVE EXAM NITRILE XS STR PU (GLOVE) IMPLANT
GOWN STRL REUS W/ TWL LRG LVL3 (GOWN DISPOSABLE) ×3 IMPLANT
GOWN STRL REUS W/ TWL XL LVL3 (GOWN DISPOSABLE) IMPLANT
GOWN STRL REUS W/TWL 2XL LVL3 (GOWN DISPOSABLE) IMPLANT
GOWN STRL REUS W/TWL LRG LVL3 (GOWN DISPOSABLE) ×6
GOWN STRL REUS W/TWL XL LVL3 (GOWN DISPOSABLE)
HEMOSTAT POWDER KIT SURGIFOAM (HEMOSTASIS) ×3 IMPLANT
HEMOSTAT SURGICEL 2X14 (HEMOSTASIS) ×3 IMPLANT
HOOK DURA (MISCELLANEOUS) IMPLANT
HOOK DURA 1/2IN (MISCELLANEOUS) ×3 IMPLANT
KIT BASIN OR (CUSTOM PROCEDURE TRAY) ×3 IMPLANT
KIT DRAIN CSF ACCUDRAIN (MISCELLANEOUS) IMPLANT
KIT ROOM TURNOVER OR (KITS) ×3 IMPLANT
KNIFE ARACHNOID DISP AM-24-S (MISCELLANEOUS) ×9 IMPLANT
MARKER SKIN DUAL TIP RULER LAB (MISCELLANEOUS) ×3 IMPLANT
NEEDLE HYPO 25GX1X1/2 BEV (NEEDLE) IMPLANT
NEEDLE HYPO 25X1 1.5 SAFETY (NEEDLE) ×3 IMPLANT
NEEDLE SPNL 25GX3.5 QUINCKE BL (NEEDLE) IMPLANT
NS IRRIG 1000ML POUR BTL (IV SOLUTION) ×6 IMPLANT
PACK CRANIOTOMY (CUSTOM PROCEDURE TRAY) ×3 IMPLANT
PAD ARMBOARD 7.5X6 YLW CONV (MISCELLANEOUS) ×3 IMPLANT
PATTIES SURGICAL .25X.25 (GAUZE/BANDAGES/DRESSINGS) IMPLANT
PATTIES SURGICAL .5 X.5 (GAUZE/BANDAGES/DRESSINGS) IMPLANT
PATTIES SURGICAL .5 X3 (DISPOSABLE) IMPLANT
PATTIES SURGICAL 1/4 X 3 (GAUZE/BANDAGES/DRESSINGS) IMPLANT
PATTIES SURGICAL 1X1 (DISPOSABLE) IMPLANT
PIN MAYFIELD SKULL DISP (PIN) ×3 IMPLANT
PLATE 1.5  2HOLE LNG NEURO (Plate) ×4 IMPLANT
PLATE 1.5 2HOLE LNG NEURO (Plate) ×2 IMPLANT
PLATE 1.5 4HOLE LONG STRAIGHT (Plate) ×3 IMPLANT
RUBBERBAND STERILE (MISCELLANEOUS) ×6 IMPLANT
SCREW SELF DRILL HT 1.5/4MM (Screw) ×18 IMPLANT
SPONGE NEURO XRAY DETECT 1X3 (DISPOSABLE) IMPLANT
SPONGE SURGIFOAM ABS GEL 100 (HEMOSTASIS) ×3 IMPLANT
SPONGE SURGIFOAM ABS GEL 100C (HEMOSTASIS) IMPLANT
STAPLER VISISTAT 35W (STAPLE) ×3 IMPLANT
STOCKINETTE 6  STRL (DRAPES) ×2
STOCKINETTE 6 STRL (DRAPES) ×1 IMPLANT
SUT ETHILON 3 0 FSL (SUTURE) IMPLANT
SUT NURALON 4 0 TR CR/8 (SUTURE) ×3 IMPLANT
SUT VIC AB 0 CT1 18XCR BRD8 (SUTURE) ×2 IMPLANT
SUT VIC AB 0 CT1 8-18 (SUTURE) ×4
SUT VIC AB 3-0 SH 8-18 (SUTURE) ×9 IMPLANT
SYR CONTROL 10ML LL (SYRINGE) ×3 IMPLANT
SYR TB 1ML 25GX5/8 (SYRINGE) IMPLANT
TOWEL OR 17X24 6PK STRL BLUE (TOWEL DISPOSABLE) ×3 IMPLANT
TOWEL OR 17X26 10 PK STRL BLUE (TOWEL DISPOSABLE) ×3 IMPLANT
TRAY FOLEY W/METER SILVER 16FR (SET/KITS/TRAYS/PACK) IMPLANT
TUBE CONNECTING 12'X1/4 (SUCTIONS) ×1
TUBE CONNECTING 12X1/4 (SUCTIONS) ×2 IMPLANT
UNDERPAD 30X30 INCONTINENT (UNDERPADS AND DIAPERS) IMPLANT
WATER STERILE IRR 1000ML POUR (IV SOLUTION) ×3 IMPLANT

## 2015-09-27 NOTE — Progress Notes (Signed)
Pt seen and examined, history reviewed. [name] initially presented after having sudden onset of HA about 11 days ago. She initially had nausea and vomiting associated. HA persisted and she was seen in the ED where CT was done and was negative. Yesterday she woke up with blurry vision. She therefore called her PCP and was instructed to come to the ED. Aside from blurry vision and HA she does not have any N/T/W.   There is no family Hx of aneurysms/ICH, she does smoke ~1/2ppd, and no hx of HTN.  EXAM: Temp:  [98.1 F (36.7 C)-98.5 F (36.9 C)] 98.1 F (36.7 C) (06/23 0816) Pulse Rate:  [46-86] 50 (06/23 0700) Resp:  [13-23] 17 (06/23 0700) BP: (107-165)/(52-94) 132/71 mmHg (06/23 0700) SpO2:  [96 %-100 %] 97 % (06/23 0700) Weight:  [63.2 kg (139 lb 5.3 oz)-65.772 kg (145 lb)] 63.2 kg (139 lb 5.3 oz) (06/23 0507) Intake/Output      06/22 0701 - 06/23 0700 06/23 0701 - 06/24 0700   I.V. (mL/kg) 150 (2.4)    IV Piggyback 105    Total Intake(mL/kg) 255 (4)    Urine (mL/kg/hr) 275    Total Output 275     Net -20           Awake, alert, oriented Speech fluent/appropriate CN grossly intact x partial CNIII palsy on right with partial ptosis. 5/5 BUE/BLE  LABS: Lab Results  Component Value Date   CREATININE 0.62 09/26/2015   BUN 15 09/26/2015   NA 138 09/26/2015   K 3.6 09/26/2015   CL 106 09/26/2015   CO2 24 09/26/2015   Lab Results  Component Value Date   WBC 7.3 09/26/2015   HGB 12.7 09/26/2015   HCT 37.2 09/26/2015   MCV 95.4 09/26/2015   PLT 285 09/26/2015    IMAGING: MRI demonstrates small amount of clival SDH. MRA shows an ~848mm inferiorly projecting right Pcom aneurysm.  IMPRESSION: - 55 y.o. female with acute occulomotor palsy and SDH from right Pcom aneurysm. Given the hemorrhage and CN palsy, would treat on an urgent basis.  PLAN: - Proceed with diagnostic angiogram likely followed by surgical clipping for occulomotor decompression  I have  reviewed the imaging findings with the patient. My recommendation for angiogram and surgical clipping was reviewed. Risks of angiogram and surgery were discussed including stroke, bleeding, SZ, hydrocephalus, infection, and risks of anesthesia including MI, stroke, and blood clot. The patient understood our discussion and all questions were answered. She is ready to proceed as above.

## 2015-09-27 NOTE — ED Notes (Signed)
NPO status initiated 

## 2015-09-27 NOTE — Op Note (Signed)
DIAGNOSTIC CEREBRAL ANGIOGRAM    OPERATOR:   Dr. Lisbeth RenshawNeelesh Aileena Iglesia, MD  HISTORY:   The patient is a 55 y.o. yo female presenting yesterday after about 10 days of HA and new onset of third nerve palsy on the right. MRA demonstrated a right Pcom aneurysm. She therefore presents for further workup with diagnostic cerebral angiogram.  APPROACH:   The technical aspects of the procedure as well as its potential risks and benefits were reviewed with the patient. These risks included but were not limited bleeding, infection, allergic reaction, damage to organs/vital structures, stroke, non-diagnostic procedure, and the catastrophic outcomes of heart attack, coma, and death. With an understanding of these risks, informed consent was obtained and witnessed.    The patient was placed in the supine position on the angiography table and the skin of right groin prepped in the usual sterile fashion. The procedure was performed under general anesthesia.  A 5- French sheath was introduced in the right common femoral artery using Seldinger technique.  A fluorophase sequence was used to document the sheath position.    HEPARIN: 0 Units total.   CONTRAST AGENT: 80cc, Omnipaque 300   FLUOROSCOPY TIME: 7.9 combined AP and lateral minutes    CATHETER(S) AND WIRE(S):    5-French JB-1 glidecatheter   0.035" glidewire    VESSELS CATHETERIZED:   Right internal carotid   Left internal carotid    Left vertebral   Right common femoral  VESSELS STUDIED:   Right internal carotid, head Right internal carotid, 3D rotational angiogram Left internal carotid, head Left vertebral Right femoral  PROCEDURAL NARRATIVE:   A 5-Fr JB-1 terumo glide catheter was advanced over a 0.035 glidewire into the aortic arch. The above vessels were then sequentially catheterized and cervical/cerebral angiograms taken. After review of images, the catheter was removed without incident.    INTERPRETATION:   Right internal carotid:  head:   Injection reveals the presence of a widely patent ICA, M1, and A1 segments and their branches. There is an aneurysm at the right posterior communicating artery origin projecting downward. This is somewhat irregular in morphology, and measures 8.4 x 4.4 mm in maximal C-C and A-P dimension.  Of note, the Pcom fills the entire right PCA territory suggesting fetal-type configuration. The parenchymal and venous phases are normal. The venous sinuses are widely patent.    Right internal carotid, 3D rotation: Using a separate and distinct workstation at a distinct time from any previous procedures, CT angiographic images were reviewed. This included multiplanar and 3D reformatted images. This further delineates the above described irregular Pcom aneurysm.  Left internal carotid: head:   Injection reveals the presence of a widely patent ICA, A1, and M1 segments and their branches. There is no significant stenosis, occlusion, aneurysm, or high flow vascular malformation visualized. The parenchymal and venous phases are normal. The venous sinuses are widely patent.    Left vertebral:   Injection reveals the presence of a widely patent vertebral artery. This leads to a widely patent basilar artery that terminates in bilateral P1. The basilar apex is normal. There is no significant stenosis, occlusion, aneurysm, or vascular malformation visualized. Of note, the right P1 is visualized and patent, with partial filling of the right PCA territory. In addition, there is reflux down the contralateral distal right vertebral, without any aneurysm seen. The right PICA is unremarkable. The parenchymal and venous phases are normal. The venous sinuses are widely patent.    Right femoral:    Normal vessel. No significant  atherosclerotic disease. Arterial sheath in adequate position.   DISPOSITION:  Upon completion of the study, the femoral sheath was removed and hemostasis obtained using a 5-Fr ExoSeal closure device.  Good proximal and distal lower extremity pulses were documented upon achievement of hemostasis.    The procedure was well tolerated and no early complications were observed.       The patient was transferred to the operating room for treatment of her aneurysm  IMPRESSION:  1. Irregular right posterior communicating artery aneurysm as described above. 2. No other aneurysms, AVM, or high-flow fistulas are seen.

## 2015-09-27 NOTE — Progress Notes (Signed)
eLink Physician-Brief Progress Note Patient Name: Mickel BaasBeverly Park DOB: 08/15/1960 MRN: [id number]   Date of Service  09/27/2015  HPI/Events of Note  Patient admitted to neurosurgery. Plan for cerebral aneurysm clipping today.   eICU Interventions  Continue current plan of care per neurosurgery.      Intervention Category Evaluation Type: New Patient Evaluation  Lawanda CousinsJennings Harman Langhans 09/27/2015, 5:41 AM

## 2015-09-27 NOTE — ED Notes (Signed)
carelink arrived  

## 2015-09-27 NOTE — Anesthesia Preprocedure Evaluation (Addendum)
Anesthesia Evaluation  Patient identified by MRN, date of birth, ID band Patient awake    Reviewed: Allergy & Precautions, NPO status , Patient's Chart, lab work & pertinent test results  Airway Mallampati: II  TM Distance: >3 FB Neck ROM: Full    Dental no notable dental hx.    Pulmonary Current Smoker,    Pulmonary exam normal breath sounds clear to auscultation       Cardiovascular negative cardio ROS Normal cardiovascular exam Rhythm:Regular Rate:Normal     Neuro/Psych  Headaches, negative psych ROS   GI/Hepatic negative GI ROS, Neg liver ROS,   Endo/Other  negative endocrine ROS  Renal/GU negative Renal ROS  negative genitourinary   Musculoskeletal negative musculoskeletal ROS (+)   Abdominal   Peds negative pediatric ROS (+)  Hematology negative hematology ROS (+)   Anesthesia Other Findings   Reproductive/Obstetrics negative OB ROS                            Anesthesia Physical Anesthesia Plan  ASA: III  Anesthesia Plan: General   Post-op Pain Management:    Induction: Intravenous  Airway Management Planned: Oral ETT  Additional Equipment: Arterial line and CVP  Intra-op Plan:   Post-operative Plan: Extubation in OR  Informed Consent: I have reviewed the patients History and Physical, chart, labs and discussed the procedure including the risks, benefits and alternatives for the proposed anesthesia with the patient or authorized representative who has indicated his/her understanding and acceptance.   Dental advisory given  Plan Discussed with: CRNA  Anesthesia Plan Comments:        Anesthesia Quick Evaluation

## 2015-09-27 NOTE — Care Management Note (Signed)
Case Management Note  Patient Details  Name: Morgan BaasBeverly Foulks MRN: [id number] Date of Birth: 05/08/1960  Subjective/Objective:  Pt admitted with SDH; for PCOM aneurysm clipping today.   PTA, pt independent of ADLS.                Action/Plan: Will follow post-procedure for discharge planning.    Expected Discharge Date:                  Expected Discharge Plan:  Home/Self Care  In-House Referral:     Discharge planning Services  CM Consult  Post Acute Care Choice:    Choice offered to:     DME Arranged:    DME Agency:     HH Arranged:    HH Agency:     Status of Service:  In process, will continue to follow  If discussed at Long Length of Stay Meetings, dates discussed:    Additional Comments:  Quintella BatonJulie W. Dion Sibal, RN, BSN  Trauma/Neuro ICU Case Manager [phone]

## 2015-09-27 NOTE — Anesthesia Postprocedure Evaluation (Signed)
Anesthesia Post Note  Patient: Mickel BaasBeverly Shukla  Procedure(s) Performed: Procedure(s) (LRB): CRANIOTOMY INTRACRANIAL ANEURYSM FOR CAROTID (Right)  Patient location during evaluation: PACU Anesthesia Type: General Level of consciousness: awake and alert and patient cooperative Pain management: pain level controlled Vital Signs Assessment: post-procedure vital signs reviewed and stable Respiratory status: spontaneous breathing, nonlabored ventilation, respiratory function stable and patient connected to nasal cannula oxygen Cardiovascular status: blood pressure returned to baseline and stable Postop Assessment: no signs of nausea or vomiting Anesthetic complications: no    Last Vitals:  Filed Vitals:   09/27/15 2040 09/27/15 2045  BP: 151/80   Pulse:  49  Temp:  36.6 C  Resp:  17    Last Pain:  Filed Vitals:   09/27/15 2048  PainSc: Asleep                 Ranferi Clingan,E. Estephan Gallardo

## 2015-09-27 NOTE — Transfer of Care (Signed)
Immediate Anesthesia Transfer of Care Note  Patient: Morgan Castillo  Procedure(s) Performed: Procedure(s): CRANIOTOMY INTRACRANIAL ANEURYSM FOR CAROTID (Right)  Patient Location: PACU  Anesthesia Type:General  Level of Consciousness: awake  Airway & Oxygen Therapy: Patient Spontanous Breathing and Patient connected to face mask oxygen  Post-op Assessment: Report given to RN, Post -op Vital signs reviewed and stable and Patient moving all extremities X 4  Post vital signs: Reviewed and stable  Last Vitals:  Filed Vitals:   09/27/15 1300 09/27/15 1400  BP: 127/66 114/65  Pulse: 57 62  Temp:    Resp: 12 21    Last Pain:  Filed Vitals:   09/27/15 2007  PainSc: Asleep         Complications: No apparent anesthesia complications

## 2015-09-27 NOTE — Anesthesia Procedure Notes (Addendum)
Procedure Name: Intubation Date/Time: 09/27/2015 2:40 PM Performed by: Reine JustFLOWERS, ROKOSHI T Pre-anesthesia Checklist: Patient identified, Emergency Drugs available, Suction available, Patient being monitored and Timeout performed Patient Re-evaluated:Patient Re-evaluated prior to inductionOxygen Delivery Method: Circle system utilized and Simple face mask Preoxygenation: Pre-oxygenation with 100% oxygen Intubation Type: IV induction Ventilation: Mask ventilation without difficulty and Oral airway inserted - appropriate to patient size Laryngoscope Size: Hyacinth MeekerMiller and 2 Grade View: Grade II Tube type: Subglottic suction tube Tube size: 7.0 mm Number of attempts: 2 Airway Equipment and Method: Patient positioned with wedge pillow and Stylet Placement Confirmation: ETT inserted through vocal cords under direct vision,  positive ETCO2 and breath sounds checked- equal and bilateral Secured at: 22 cm Tube secured with: Tape Dental Injury: Teeth and Oropharynx as per pre-operative assessment     Central Venous Catheter Insertion 09/27/2015 5:00 PM Patient location: OR. Emergency situation Preanesthetic checklist: patient identified, IV checked, site marked, risks and benefits discussed, surgical consent, monitors and equipment checked, pre-op evaluation, timeout performed and anesthesia consent Position: Trendelenburg Landmarks identified and Seldinger technique used Central line was placed.Double lumen Procedure performed using ultrasound guided technique. Attempts: 2 Following insertion, line sutured, dressing applied and Biopatch. Post procedure assessment: blood return through all ports, free fluid flow and no air. Patient tolerated the procedure well with no immediate complications.

## 2015-09-27 NOTE — Op Note (Signed)
PREOP DIAGNOSIS: Right posterior communicating artery aneurysm  POSTOP DIAGNOSIS: Same  PROCEDURE: 1. Right fronto-temporal craniotomy for clipping of posterior communicating artery aneurysm 2. Use of operating microscope for microdissection 3. Use of intraoperative ICG videoangiography   SURGEON: Dr. Lisbeth RenshawNeelesh Shadman Tozzi, MD  ASSISTANT: Dr. Coletta MemosKyle Cabbell, MD  ANESTHESIA: General Endotracheal  EBL: 200cc  SPECIMENS: None  DRAINS: None  COMPLICATIONS: None immediate  CONDITION: Hemodynamically stable to PACU  HISTORY: Morgan Castillo is a 55 y.o. female presenting with 10d of HA, and new onset third nerve palsy. MRA demonstrated an 8mm right Pcom aneurysm. With her acute palsy, I recommended surgical clipping for decompression. Risks and benefits were discussed in detail with the patient. After all questions were answered, consent was obtained. Diagnostic angiogram was then done immediately prior to the surgery.  PROCEDURE IN DETAIL: The patient was brought directly from the angiography suite under general anesthesia and she was positioned on the operative table in the supine position in the Mayfield headholder. All pressure points were meticulously padded. Skin incision was then marked out and prepped and draped in the usual sterile fashion.  After time-out was conducted, skin incision was infiltrated with local and incision was made sharply and Bovie electrocautery was used to dissect the subcutaneous tissue and galea. Raney clips were then used to secure hemostasis on the skin edges. The superficial temporal artery was dissected free and retracted with the skin flap. Interfascial dissection was carried out and the skin flap was elevated and reflected anteriorly. Temporalis muscle and fascia was incised and reflected inferiorly. Bur holes were then created in the pterion, above the root of the zygoma, and the superior temporal line. These are then connected with the craniotome and a  standard pterional craniotomy flap was elevated. Hemostasis was achieved on the bone edges, and a high-speed drill was used to drill down the lesser wing of the sphenoid.  The dura was then opened in curvilinear fashion and good hemostasis was achieved on the dural edges. At this point the microscope was draped and brought into the field and the remainder of the case was done under the microscope using microdissection.  The optic nerve was identified and the opticocarotid cistern opened and dissection was carried laterally until the internal carotid artery was identified. Our attention was then turned to dissection of the internal carotid artery to the level of the ICA bifurcation. At this point the distal sylvian fissure was split in standard fashion. The aneurysm neck was identified, somewhat adherent to the tentorium. The distal Pcom was easily identified in the optico-carotid space coursing posteriorly. Initially, the aneurysm neck was dissected sharply away from the tentorium. A long straight standard titanium clip was then selected and placed across the neck of the aneurysm. Clip was inspected and did appear to completely cover the aneurysm neck.  Once hemostasis was secured using a combination of bipolar electrocautery and passive hemostatics, the anesthesia service was instructed to administer a 12.5 mg bolus of ICG. Under the infrared camera, we confirmed the patency of the ICA as well as the distal Pcom. The aneurysm dome was not filling. We therefore punctured the aneurysm dome for decompression. It was apparent the dome was compressing the occulomotor nerve between it and the tentorium.  At this point the wound is irrigated with copious amounts of normal saline irrigation. Good hemostasis was confirmed on the brain surface. The dura was then approximated using a combination of interrupted 4-0 Nurolon stitches. A piece of Duramatrix was then placed over  the dural surface. Bone flap was then  replaced and plated with standard titanium plates and screws.  Muscle was then closed using interrupted 0 Vicryl stitches, and the galea was closed using interrupted 3-0 Vicryl sutures. The skin was closed using standard surgical skin staples. Sterile dressing was then applied after the Mayfield head holder was removed. The patient was then transferred to the stretcher and taken to the PACU in stable hemodynamic condition.  At the end of the case all sponge, needle, cottonoid, and instrument counts were correct.

## 2015-09-28 NOTE — Progress Notes (Signed)
Postop day 1. Patient notes some mild headache. No other complaints. Diplopia stable to slightly improved.  Afebrile. Patient mildly bradycardic. Blood pressure normal. Urine output good. Patient is awake and alert. She is oriented and appropriate. She follows commands bilaterally. No pronator drift. Wound clean and dry. Third nerve palsy on right unchanged.  Status post craniotomy and clipping of aneurysm. Overall doing well. Begin to mobilize.

## 2015-09-29 NOTE — Progress Notes (Signed)
Postop day 2. Patient overall stable. Complaints of some headache. No other symptoms aside from diplopia.  Afebrile. Vitals are stable. Fluid balance positive. Awake and alert. Oriented and appropriate. Right third nerve palsy unchanged. Motor 5/5 bilaterally without pronator drift. Dressing dry. Chest and abdomen benign.  Progressing well. Continue efforts at mobilization.

## 2015-09-29 NOTE — Evaluation (Signed)
Physical Therapy Evaluation Patient Details Name: Morgan BaasBeverly Castillo MRN: [id number] DOB: 11/03/1960 Today's Date: 09/29/2015   History of Present Illness  55 y.o. [facility] for HA, double vision.  HA, double vision.  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  Pt s/p R fronto-temporal craniotomy for clipping of posterior communicating artery aneurysm on 09/27/15.  Pt wtih significant PMHx of back pain, migraines, and back surgery.    Clinical Impression  Pt generally weak, double vision seems to have improved.  Walking slowly with support of RW, but not much physical assist.  I hope she will progress well enough to not need RW or f/u PT, but it will just depend on her improvement and timing of d/c.   PT to follow acutely for deficits listed below.       Follow Up Recommendations Home health PT    Equipment Recommendations  Rolling walker with 5" wheels    Recommendations for Other Services   NA    Precautions / Restrictions Precautions Precautions: None      Mobility  Bed Mobility Overal bed mobility: Needs Assistance Bed Mobility: Supine to Sit     Supine to sit: HOB elevated     General bed mobility comments: min guard assist  Transfers Overall transfer level: Needs assistance Equipment used: Rolling walker (2 wheeled) Transfers: Sit to/from Stand Sit to Stand: Min guard         General transfer comment: min guard assist for balance during transitions.  Second person for line management.   Ambulation/Gait Ambulation/Gait assistance: Min guard Ambulation Distance (Feet): 75 Feet Assistive device: Rolling walker (2 wheeled) Gait Pattern/deviations: Step-through pattern Gait velocity: decreased Gait velocity interpretation: Below normal speed for age/gender General Gait Details: no inequality noted in LEs during functional gait.   Generally weak on her feet.       Modified Rankin (Stroke Patients Only) Modified Rankin (Stroke Patients Only) Pre-Morbid Rankin Score: No symptoms Modified Rankin: Moderately severe disability     Balance Overall balance assessment: Needs assistance Sitting-balance support: Feet supported;No upper extremity supported Sitting balance-Leahy Scale: Good     Standing balance support: Bilateral upper extremity supported Standing balance-Leahy Scale: Fair                               Pertinent Vitals/Pain Pain Assessment: 0-10 Pain Score: 5  Pain Location: head Pain Descriptors / Indicators: Aching Pain Intervention(s): Limited activity within patient's tolerance;Monitored during session;Repositioned    Home Living Family/patient expects to be discharged to:: Private residence Living Arrangements: Spouse/significant other;Children (husband, son) Available Help at Discharge: Family;Available 24 hours/day Type of Home: House Home Access: Stairs to enter Entrance Stairs-Rails: Left Entrance Stairs-Number of Steps: 5 Home Layout: Laundry or work area in basement        Prior Function Level of Independence: Independent         Comments: drives, 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       Hand Dominance   Dominant Hand: Right    Extremity/Trunk Assessment   Upper Extremity Assessment: Defer to OT evaluation           Lower Extremity Assessment: Generalized weakness      Cervical / Trunk Assessment: Normal (although, chart reports h/o back pain and surgery)  Communication   Communication: No difficulties  Cognition Arousal/Alertness: Awake/alert Behavior During Therapy: WFL for tasks assessed/performed Overall Cognitive Status: Within Functional Limits for tasks assessed                               Assessment/Plan    PT Assessment Patient needs continued PT services  PT Diagnosis Difficulty  walking;Generalized weakness;Abnormality of gait;Acute pain   PT Problem List Decreased strength;Decreased activity tolerance;Decreased balance;Decreased mobility;Decreased knowledge of use of DME;Decreased skin integrity  PT Treatment Interventions DME instruction;Gait training;Stair training;Functional mobility training;Therapeutic activities;Therapeutic exercise;Balance training;Neuromuscular re-education;Patient/family education   PT Goals (Current goals can be found in the Care Plan section) Acute Rehab PT Goals Patient Stated Goal: to get better, go home, return to normal, return to work PT Goal Formulation: With patient Time For Goal Achievement: 10/13/15 Potential to Achieve Goals: Good    Frequency Min 4X/week           End of Session Equipment Utilized During Treatment: Gait belt Activity Tolerance: Patient limited by fatigue Patient left: in chair;with call bell/phone within reach;with chair alarm set Nurse Communication: Mobility status         Time: 8119-14781514-1542 PT Time Calculation (min) (ACUTE ONLY): 28 min   Charges:   PT Evaluation $PT Eval Moderate Complexity: 1 Procedure PT Treatments $Gait Training: 8-22 mins        Courtnay Petrilla B. Nury Nebergall, PT, DPT [phone]   09/29/2015, 5:16 PM

## 2015-09-30 ENCOUNTER — Encounter (HOSPITAL_COMMUNITY): Payer: Self-pay | Admitting: Neurosurgery

## 2015-09-30 MED ORDER — PNEUMOCOCCAL VAC POLYVALENT 25 MCG/0.5ML IJ INJ
0.5000 mL | INJECTION | INTRAMUSCULAR | Status: AC
  Administered 2015-10-01: 0.5 mL via INTRAMUSCULAR
  Filled 2015-09-30: qty 0.5

## 2015-09-30 NOTE — Progress Notes (Signed)
Report called to 59M RN.  Pt continues to have a mild HA (4/10) despite pain meds.  VSS

## 2015-09-30 NOTE — Evaluation (Signed)
Occupational Therapy Evaluation Patient Details Name: Morgan BaasBeverly Castillo MRN: [id number] DOB: 08/07/1960 Today's Date: 09/30/2015    History of Present Illness 55 y.o. [facility] for HA, double vision.  HA, double vision.  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  Pt s/p R fronto-temporal craniotomy for clipping of posterior communicating artery aneurysm on 09/27/15.  Pt wtih significant PMHx of back pain, migraines, and back surgery.     Clinical Impression   Pt reports she was independent with ADLs PTA. Currently pt overall min guard for safety with ADLs and functional mobility. Pt c/o R knee pain and headache after mobility; RN notified. VSS throughout session. Pt reports vision has returned to baseline; pt noted to have intermittent disconjugate gaze but [facility]FL during activities. Recommending HHOT for follow up in order to maximize independence and safety with ADLs and functional mobility upon return home. Pt would benefit from continued skilled OT to address established goals.    Follow Up Recommendations  Home health OT;Supervision/Assistance - 24 hour    Equipment Recommendations  None recommended by OT    Recommendations for Other Services       Precautions / Restrictions Precautions Precautions: None Restrictions Weight Bearing Restrictions: No      Mobility Bed Mobility Overal bed mobility: Needs Assistance Bed Mobility: Supine to Sit     Supine to sit: Supervision;HOB elevated     General bed mobility comments: Supervision for safety; no physical assist required. HOB elevated without use of bed rails.  Transfers Overall transfer level: Needs assistance Equipment used: None Transfers: Sit to/from UGI CorporationStand;Stand Pivot Transfers Sit to Stand: Min guard Stand pivot transfers: Min guard       General transfer comment: Min guard for  safety; no physical assist required. Pt slightly off balance; pt reports she believes its due to R knee pain.    Balance Overall balance assessment: Needs assistance Sitting-balance support: Feet supported;No upper extremity supported Sitting balance-Leahy Scale: Good     Standing balance support: No upper extremity supported;During functional activity Standing balance-Leahy Scale: Fair                              ADL Overall ADL's : Needs assistance/impaired Eating/Feeding: Independent;Sitting   Grooming: Min guard;Standing;Oral care;Wash/dry face   Upper Body Bathing: Set up;Supervision/ safety;Sitting   Lower Body Bathing: Min guard;Sit to/from stand   Upper Body Dressing : Set up;Supervision/safety;Sitting   Lower Body Dressing: Min guard;Sit to/from stand   Toilet Transfer: Min guard;Ambulation;Regular Teacher, adult educationToilet Toilet Transfer Details (indicate cue type and reason): Simulated by sit to stand from chair and short distance ambulation in room to sink. Toileting- ArchitectClothing Manipulation and Hygiene: Min guard;Sit to/from stand       Functional mobility during ADLs: Min guard General ADL Comments: Disucssed HHOT for follow up; pt agreeable. Pt c/o new R knee pain.     Vision Vision Assessment?: Yes Eye Alignment: Impaired (comment) (intermittent disconjugate gaze) Ocular Range of Motion: Within Functional Limits Tracking/Visual Pursuits: Able to track stimulus in all quads without difficulty Saccades: Within functional limits Visual Fields: No apparent deficits Additional Comments: Pt reports she was experiencing double vision but it has resolved now.   Perception     Praxis      Pertinent Vitals/Pain Pain Assessment: Faces Faces Pain Scale: Hurts even more Pain Location: head and  R knee Pain Descriptors / Indicators: Aching;Grimacing;Sore;Headache Pain Intervention(s): Monitored during session;Repositioned;Patient requesting pain meds-RN notified      Hand Dominance Right   Extremity/Trunk Assessment Upper Extremity Assessment Upper Extremity Assessment: Overall WFL for tasks assessed   Lower Extremity Assessment Lower Extremity Assessment: Defer to PT evaluation   Cervical / Trunk Assessment Cervical / Trunk Assessment: Normal   Communication Communication Communication: No difficulties   Cognition Arousal/Alertness: Awake/alert Behavior During Therapy: WFL for tasks assessed/performed Overall Cognitive Status: Within Functional Limits for tasks assessed                     General Comments       Exercises       Shoulder Instructions      Home Living Family/patient expects to be discharged to:: Private residence Living Arrangements: Spouse/significant other;Children (husband, son) Available Help at Discharge: Family;Available 24 hours/day Type of Home: House Home Access: Stairs to enter Entergy CorporationEntrance Stairs-Number of Steps: 5 Entrance Stairs-Rails: Left Home Layout: Two level;Able to live on main level with bedroom/bathroom;Laundry or work area in basement     SunGardBathroom Shower/Tub: Producer, television/film/videoWalk-in shower   Bathroom Toilet: Standard     Home Equipment: None          Prior Functioning/Environment Level of Independence: Independent        Comments: drives, 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      OT Diagnosis: Generalized weakness;Acute pain   OT Problem List: Decreased strength;Decreased activity tolerance;Impaired balance (sitting and/or standing);Decreased knowledge of use of DME or AE;Pain   OT Treatment/Interventions: Self-care/ADL training;Energy conservation;DME and/or AE instruction;Therapeutic activities;Patient/family education;Balance training    OT Goals(Current goals can be found in the care plan section) Acute Rehab OT Goals Patient Stated Goal: to get better, go home, return to normal, return to work OT Goal Formulation: With patient Time For Goal  Achievement: 10/14/15 Potential to Achieve Goals: Good ADL Goals Pt Will Perform Grooming: with modified independence;standing Pt Will Perform Upper Body Bathing: with modified independence;sitting Pt Will Perform Lower Body Bathing: with modified independence;sit to/from stand Pt Will Transfer to Toilet: with modified independence;ambulating;regular height toilet Pt Will Perform Toileting - Clothing Manipulation and hygiene: with modified independence;sit to/from stand  OT Frequency: Min 2X/week   Barriers to D/C:            Co-evaluation              End of Session Equipment Utilized During Treatment: Gait belt Nurse Communication: Mobility status;Patient requests pain meds  Activity Tolerance: Patient tolerated treatment well Patient left: in chair;with call bell/phone within reach;with family/visitor present   Time: 1224-1250 OT Time Calculation (min): 26 min Charges:  OT General Charges $OT Visit: 1 Procedure OT Evaluation $OT Eval Moderate Complexity: 1 Procedure OT Treatments $Self Care/Home Management : 8-22 mins G-Codes:     Gaye AlkenBailey A Tamora Huneke M.S., OTR/L Pager: [phone]  09/30/2015, 1:03 PM

## 2015-09-30 NOTE — Progress Notes (Signed)
Physical Therapy Treatment Patient Details Name: Morgan BaasBeverly Castillo MRN: [id number] DOB: 12/30/1960 Today's Date: 09/30/2015    History of Present Illness 55 y.o. [facility] for HA, double vision.  HA, double vision.  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  Pt s/p R fronto-temporal craniotomy for clipping of posterior communicating artery aneurysm on 09/27/15.  Pt wtih significant PMHx of back pain, migraines, and back surgery.      PT Comments    Pt is progressing towards goals. Pt did not use a RW for ambulation. Pt is experiencing R knee pain, slightly affecting gait stability and speed. Will try to use cane to help with speed and stability due to R knee pain next session.   Follow Up Recommendations  Home health PT     Equipment Recommendations  Cane       Precautions / Restrictions Precautions Precautions: None Restrictions Weight Bearing Restrictions: No    Mobility  Bed Mobility Overal bed mobility: Needs Assistance Bed Mobility: Supine to Sit     Supine to sit: Supervision     General bed mobility comments: Supervision for safety; no physical assist required. HOB elevated without use of bed rails.  Transfers Overall transfer level: Needs assistance Equipment used: None Transfers: Sit to/from Stand Sit to Stand: Min guard         General transfer comment: Min quard for safety; no physical assist required  Ambulation/Gait Ambulation/Gait assistance: Min guard Ambulation Distance (Feet): 75 Feet Assistive device: None Gait Pattern/deviations: Step-to pattern;Shuffle;Antalgic Gait velocity: decreased Gait velocity interpretation: Below normal speed for age/gender General Gait Details: Pt's R knee buckled during L turn, pt stated due to pain; min guard assist for buckle and safety    Modified Rankin (Stroke Patients  Only) Modified Rankin (Stroke Patients Only) Pre-Morbid Rankin Score: No symptoms Modified Rankin: Moderately severe disability     Balance Overall balance assessment: No apparent balance deficits (not formally assessed) Sitting-balance support: No upper extremity supported;Feet supported Sitting balance-Leahy Scale: Normal     Standing balance support: No upper extremity supported;During functional activity Standing balance-Leahy Scale: Good                      Cognition Arousal/Alertness: Awake/alert Behavior During Therapy: WFL for tasks assessed/performed Overall Cognitive Status: Within Functional Limits for tasks assessed                           General Comments General comments (skin integrity, edema, etc.): R knee slightly warmer than L, no swelling noted      Pertinent Vitals/Pain Pain Assessment: Faces Faces Pain Scale: Hurts a little bit Pain Location: R knee Pain Descriptors / Indicators: Aching;Pressure;Sore Pain Intervention(s): Monitored during session;Repositioned           PT Goals (current goals can now be found in the care plan section) Progress towards PT goals: Progressing toward goals    Frequency  Min 4X/week    PT Plan Current plan remains appropriate       End of Session Equipment Utilized During Treatment: Gait belt Activity Tolerance: Patient tolerated treatment well Patient left: in chair;with call bell/phone within reach;with chair alarm set;with family/visitor present     Time: [id number] PT Time Calculation (min) (ACUTE ONLY): 33 min  Charges:  $Gait Training: 8-22 mins $Therapeutic Activity: 8-22 mins  G CodesKevan Ny:      Tuyen Uncapher, SPTA #[phone] 09/30/2015, 5:22 PM

## 2015-09-30 NOTE — OR Nursing (Signed)
Addendum made to case record. Wound class updated.

## 2015-09-30 NOTE — Progress Notes (Signed)
Pt seen and examined. No issues overnight. Minimal HA. Reports no double vision. Tolerating diet, voiding normally.  EXAM: Temp:  [97.8 F (36.6 C)-99 F (37.2 C)] 97.8 F (36.6 C) (06/26 1200) Pulse Rate:  [55-72] 56 (06/26 1300) Resp:  [8-29] 12 (06/26 1300) BP: (99-144)/(49-77) 124/69 mmHg (06/26 1300) SpO2:  [93 %-100 %] 98 % (06/26 1300) Intake/Output      06/25 0701 - 06/26 0700 06/26 0701 - 06/27 0700   P.O.     I.V. (mL/kg) 1800 (28.5) 450 (7.1)   IV Piggyback 210 105   Total Intake(mL/kg) 2010 (31.8) 555 (8.8)   Urine (mL/kg/hr) 300 (0.2)    Total Output 300     Net +1710 +555        Urine Occurrence 3 x 1 x    Awake, alert, oriented Speech fluent CN intact, EOMI, no ptosis Good strength  LABS: Lab Results  Component Value Date   CREATININE 0.62 09/26/2015   BUN 15 09/26/2015   NA 138 09/26/2015   K 3.6 09/26/2015   CL 106 09/26/2015   CO2 24 09/26/2015   Lab Results  Component Value Date   WBC 7.3 09/26/2015   HGB 12.7 09/26/2015   HCT 37.2 09/26/2015   MCV 95.4 09/26/2015   PLT 285 09/26/2015    IMPRESSION: - 55 y.o. 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

## 2015-10-01 LAB — TYPE AND SCREEN
ABO/RH(D): B POS
Antibody Screen: NEGATIVE
UNIT DIVISION: 0
Unit division: 0

## 2015-10-01 MED ORDER — LEVETIRACETAM 500 MG PO TABS
500.0000 mg | ORAL_TABLET | Freq: Two times a day (BID) | ORAL | Status: DC
  Administered 2015-10-01 – 2015-10-02 (×2): 500 mg via ORAL
  Filled 2015-10-01 (×2): qty 1

## 2015-10-01 MED ORDER — POLYETHYLENE GLYCOL 3350 17 G PO PACK
17.0000 g | PACK | Freq: Every day | ORAL | Status: DC
  Administered 2015-10-01: 17 g via ORAL
  Filled 2015-10-01 (×2): qty 1

## 2015-10-01 MED ORDER — MORPHINE SULFATE (PF) 2 MG/ML IV SOLN
INTRAVENOUS | Status: AC
  Administered 2015-10-01: 2 mg via INTRAVENOUS
  Filled 2015-10-01: qty 1

## 2015-10-01 NOTE — Progress Notes (Signed)
Physical Therapy Treatment Patient Details Name: Morgan Castillo MRN: [id number] DOB: 07/31/1960 Today's Date: 10/01/2015    History of Present Illness 55 y.o. [facility] for HA, double vision.  HA, double vision.  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  Pt s/p R fronto-temporal craniotomy for clipping of posterior communicating artery aneurysm on 09/27/15.  Pt wtih significant PMHx of back pain, migraines, and back surgery.      PT Comments    Patient is progressing toward mobility goals. Tolerated stair/gait training with min guard. Continues to c/o headache and with slow gait. Continue to progress as tolerated with anticipated d/c home with HHPT.   Follow Up Recommendations  Home health PT     Equipment Recommendations  Cane    Recommendations for Other Services       Precautions / Restrictions Precautions Precautions: None Restrictions Weight Bearing Restrictions: No    Mobility  Bed Mobility Overal bed mobility: Modified Independent Bed Mobility: Supine to Sit     Supine to sit: Modified independent (Device/Increase time)     General bed mobility comments: increased time and HOB elevated  Transfers Overall transfer level: Needs assistance Equipment used: None Transfers: Sit to/from Stand Sit to Stand: Supervision         General transfer comment: supervision for safety; unsteady upon inital stand from EOB but no LOB  Ambulation/Gait Ambulation/Gait assistance: Min guard Ambulation Distance (Feet): 200 Feet Assistive device: None Gait Pattern/deviations: Step-through pattern;Decreased stride length;Trunk flexed Gait velocity: decreased   General Gait Details: cues for posture, bilat heel strike, and cadence; pt with improved stride length with increased distance and frequent cues; no knee instability or  LOB   Stairs Stairs: Yes Stairs assistance: Supervision Stair Management: One rail Left;Forwards Number of Stairs: 2 General stair comments: educated on step to pattern  Wheelchair Mobility    Modified Rankin (Stroke Patients Only) Modified Rankin (Stroke Patients Only) Pre-Morbid Rankin Score: No symptoms Modified Rankin: Moderately severe disability     Balance     Sitting balance-Leahy Scale: Normal       Standing balance-Leahy Scale: Good                      Cognition Arousal/Alertness: Awake/alert Behavior During Therapy: WFL for tasks assessed/performed Overall Cognitive Status: Within Functional Limits for tasks assessed                      Exercises      General Comments        Pertinent Vitals/Pain Pain Assessment: Faces Faces Pain Scale: Hurts little more Pain Location: head Pain Descriptors / Indicators: Aching;Discomfort;Pressure Pain Intervention(s): Limited activity within patient's tolerance;Monitored during session;Premedicated before session;Repositioned    Home Living                      Prior Function            PT Goals (current goals can now be found in the care plan section) Acute Rehab PT Goals Patient Stated Goal: go home Progress towards PT goals: Progressing toward goals    Frequency  Min 4X/week    PT Plan Current plan remains appropriate    Co-evaluation             End of Session Equipment Utilized During Treatment: Gait belt Activity Tolerance: Patient tolerated  treatment well Patient left: in chair;with call bell/phone within reach;with family/visitor present;with nursing/sitter in room     Time: 1610-96041309-1333 PT Time Calculation (min) (ACUTE ONLY): 24 min  Charges:  $Gait Training: 23-37 mins                    G Codes:      Morgan Castillo, PTA Pager: [phone]   10/01/2015, 2:41 PM

## 2015-10-01 NOTE — Progress Notes (Signed)
No issues overnight x HA. Otherwise no c/o this am.  EXAM:  BP 130/63 mmHg  Pulse 67  Temp(Src) 98.5 F (36.9 C) (Oral)  Resp 16  Ht 5\' 5"  (1.651 m)  Wt 63.2 kg (139 lb 5.3 oz)  BMI 23.19 kg/m2  SpO2 98%  LMP 09/21/2015  Awake, alert, oriented  Speech fluent, appropriate  CN grossly intact  5/5 BUE/BLE   IMPRESSION:  55 y.o. female POD#4 s/p clipping right Pcom aneurysm, doing well  PLAN: - Will d/c IV morphine, if ok on oral meds can likely d/c home tomorrow with HHPT

## 2015-10-01 NOTE — Care Management Note (Signed)
Case Management Note  Patient Details  Name: Morgan Castillo MRN: [id number] Date of Birth: 11/10/1960  Subjective/Objective:                    Action/Plan: Pt with s/p aneurysm clipping. Pt continues with headache. CM following for discharge needs.   Expected Discharge Date:                  Expected Discharge Plan:  Home/Self Care  In-House Referral:     Discharge planning Services  CM Consult  Post Acute Care Choice:    Choice offered to:     DME Arranged:    DME Agency:     HH Arranged:    HH Agency:     Status of Service:  In process, will continue to follow  If discussed at Long Length of Stay Meetings, dates discussed:    Additional Comments:  Kermit BaloKelli F Francely Craw, RN 10/01/2015, 10:40 AM

## 2015-10-02 MED ORDER — LEVETIRACETAM 500 MG PO TABS: 500.0000 mg | ORAL_TABLET | Freq: Two times a day (BID) | ORAL | Status: AC

## 2015-10-02 MED ORDER — HYDROCODONE-ACETAMINOPHEN 10-325 MG PO TABS: 1.0000 | ORAL_TABLET | Freq: Four times a day (QID) | ORAL | Status: AC | PRN

## 2015-10-02 NOTE — Care Management Note (Signed)
Case Management Note  Patient Details  Name: Morgan Castillo MRN: [id number] Date of Birth: [birth year]  Subjective/Objective:                    Action/Plan: Pt discharging home with orders for [facility] services. CM met with the patient and provided her a list of [place] agencies in the [place] area. She selected [place]. [name] with [facility] notified and accepted the referral. Pt also ordered a cane. Jermaine with [facility] DME notified and will deliver the cane to the room. Bedside RN updated.   Expected Discharge Date:                  Expected Discharge Plan:  [place]  In-House Referral:     Discharge planning Services  CM Consult  Post Acute Care Choice:  Durable Medical Equipment, Home Health Choice offered to:  Patient  DME Arranged:  Kasandra Knudsen DME Agency:  [place] Arranged:  PT, OT [facility] Agency:  [place]  Status of Service:  Completed, signed off  If discussed at [place] of Stay Meetings, dates discussed:    Additional Comments:  Pollie Friar, RN 10/02/2015, 4:32 PM

## 2015-10-02 NOTE — Progress Notes (Signed)
Physical Therapy Treatment Patient Details Name: Morgan BaasBeverly Crescenzo MRN: [id number] DOB: 02/11/1961 Today's Date: 10/02/2015    History of Present Illness 55 y.o. female admitted to [facility] on 09/26/15 for HA, double vision.  HA, double vision.  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  Pt s/p R fronto-temporal craniotomy for clipping of posterior communicating artery aneurysm on 09/27/15.  Pt wtih significant PMHx of back pain, migraines, and back surgery.      PT Comments    Patient progressing well towards PT goals. Improved ambulation distance today and able to demonstrate more normalized gait pattern with cues for increased cadence and step length. Tolerated stair training with supervision for safety. Continues to have a headache worsened with mobility. Will plan for higher level balance challenges tomorrow as tolerated.   Follow Up Recommendations  Home health PT;Supervision - Intermittent     Equipment Recommendations       Recommendations for Other Services       Precautions / Restrictions Precautions Precautions: None Restrictions Weight Bearing Restrictions: No    Mobility  Bed Mobility Overal bed mobility: Needs Assistance Bed Mobility: Supine to Sit     Supine to sit: Modified independent (Device/Increase time);HOB elevated     General bed mobility comments: increased time and HOB elevated, no assist needed.  Transfers Overall transfer level: Needs assistance Equipment used: None Transfers: Sit to/from Stand Sit to Stand: Supervision         General transfer comment: supervision for safety; unsteady upon inital stand from EOB but no LOB.  Ambulation/Gait Ambulation/Gait assistance: Min guard Ambulation Distance (Feet): 250 Feet Assistive device: None Gait Pattern/deviations: Step-through pattern;Decreased stride length;Trunk flexed Gait velocity:  decreased Gait velocity interpretation: Below normal speed for age/gender General Gait Details: very slow gait with some drifting; cues to increase cadence and speed to improve balance; no knee instability noted.    Stairs Stairs: Yes Stairs assistance: Supervision Stair Management: One rail Left;Alternating pattern Number of Stairs: 2 (+ 3 steps x2 bouts) General stair comments: Good demo of technique.  Wheelchair Mobility    Modified Rankin (Stroke Patients Only) Modified Rankin (Stroke Patients Only) Pre-Morbid Rankin Score: No symptoms Modified Rankin: Moderately severe disability     Balance Overall balance assessment: Needs assistance Sitting-balance support: Feet supported;No upper extremity supported Sitting balance-Leahy Scale: Normal Sitting balance - Comments: ABle to donn socks sitting EOB without difficulty.    Standing balance support: During functional activity Standing balance-Leahy Scale: Good                      Cognition Arousal/Alertness: Awake/alert Behavior During Therapy: WFL for tasks assessed/performed Overall Cognitive Status: Within Functional Limits for tasks assessed                      Exercises      General Comments        Pertinent Vitals/Pain Pain Assessment: Faces Faces Pain Scale: Hurts even more Pain Location: headache Pain Descriptors / Indicators: Headache Pain Intervention(s): Monitored during session;Repositioned    Home Living                      Prior Function            PT Goals (current goals can now be found in the care plan section) Progress towards PT goals: Progressing toward goals  Frequency  Min 4X/week    PT Plan Current plan remains appropriate    Co-evaluation             End of Session Equipment Utilized During Treatment: Gait belt Activity Tolerance: Patient tolerated treatment well Patient left: in chair;with call bell/phone within reach;with family/visitor  present     Time: 1610-96041138-1201 PT Time Calculation (min) (ACUTE ONLY): 23 min  Charges:  $Gait Training: 23-37 mins                    G Codes:      Cera Rorke A Larine Fielding 10/02/2015, 12:31 PM Mylo RedShauna Malaysha Arlen, PT, DPT [phone]

## 2015-10-02 NOTE — Progress Notes (Signed)
Dressing removed, staples that held dressing on removed, stapled incision cleansed with normal saline, new dressing applied.pt to be discharge to home via wheelchair with husband and nurse.all discharge instructions reviewed with pt and family and family understood.

## 2015-10-02 NOTE — Discharge Summary (Signed)
Physician Discharge Summary  Patient ID: Morgan Castillo MRN: [id number] DOB/AGE: 53/05/1960 53 y.o.  Admit date: 09/26/2015 Discharge date: 10/02/2015  Admission Diagnoses: Subarachnoid hemorrhage Cranial nerve palsy  Discharge Diagnoses: Same Active Problems:   Subarachnoid bleed [facility](H[facility])   Discharged Condition: Stable  Hospital Course:  Morgan Castillo is a 55 y.o. female admitted with acute onset 3rd nerve palsy and one week of HA. CT demonstrated primarily subdural hematoma, and CTA and diagnostic angiogram demonstrated a right Pcom aneurysm. She underwent surgical clipping without complication. Her CN III palsy resolved after 2 days. She was ambulating well, tolerating diet, and voiding normally. HA was controlled with oral medication. PT/OT recommended home health therapy.  Treatments: Surgery - Right pterional craniotomy for clipping of aneurysm  Discharge Exam: Blood pressure 142/61, pulse 59, temperature 98.4 F (36.9 C), temperature source Oral, resp. rate 18, height 5\' 5"  (1.651 m), weight 63.2 kg (139 lb 5.3 oz), last menstrual period 09/21/2015, SpO2 100 %. Awake, alert, oriented Speech fluent, appropriate CN grossly intact 5/5 BUE/BLE Wound c/d/i  Disposition: 01-Home or Self Care  Discharge Instructions    Ambulatory referral to Home Health    Complete by:  As directed   Please evaluate Morgan Castillo for admission to [facility].  Disciplines requested: Physical Therapy and Occupational Therapy  Services to provide: Strengthening Exercises and Evaluate  Physician to follow patient's care (the person listed here will be responsible for signing ongoing orders): Referring Provider  Requested Start of Care Date: Tomorrow  I certify that this patient is under my care and that I, or a Nurse Practitioner or Physician's Assistant working with me, had a face-to-face encounter that meets the physician face-to-face requirements with patient on 10/02/15. The  encounter with the patient was in whole, or in part for the following medical condition(s) which is the primary reason for home health care (List medical condition): Aneurysm rupture/subarchnoid hemorrhage  Does the patient have Medicare or Medicaid?:  No  The encounter with the patient was in whole, or in part, for the following medical condition, which is the primary reason for home health care:  subarachnoid hemorrhage  Reason for Medically Necessary Home Health Services:  Therapy- Therapeutic Exercises to Increase Strength and Endurance  My clinical findings support the need for the above services:  Unable to leave home safely without assistance and/or assistive device  I certify that, based on my findings, the following services are medically necessary home health services:  Physical therapy  Further, I certify that my clinical findings support that this patient is homebound due to:  Unable to leave home safely without assistance            Medication List    STOP taking these medications        methylPREDNISolone 4 MG Tbpk tablet  Commonly known as:  MEDROL DOSEPAK      TAKE these medications        HYDROcodone-acetaminophen 10-325 MG tablet  Commonly known as:  NORCO  Take 1 tablet by mouth every 6 (six) hours as needed for moderate pain.     levETIRAcetam 500 MG tablet  Commonly known as:  KEPPRA  Take 1 tablet (500 mg total) by mouth 2 (two) times daily.     silver sulfADIAZINE 1 % cream  Commonly known as:  SILVADENE  Apply 1 application topically daily as needed (eczema).           Follow-up Information    Follow up with Jhana Giarratano, C,  MD In 2 weeks.   Specialty:  Neurosurgery   Contact information:   [address] [place] [address] [phone]       Signed: Lisbeth RenshawUNDKUMAR, Rupinder Livingston, Salena SanerC 10/02/2015, 2:52 PM

## 2016-01-03 ENCOUNTER — Encounter (HOSPITAL_COMMUNITY): Payer: Self-pay | Admitting: Neurosurgery

## 2016-07-30 ENCOUNTER — Other Ambulatory Visit: Payer: Self-pay | Admitting: Neurosurgery

## 2016-07-30 DIAGNOSIS — I609 Nontraumatic subarachnoid hemorrhage, unspecified: Secondary | ICD-10-CM

## 2016-09-17 ENCOUNTER — Inpatient Hospital Stay (HOSPITAL_COMMUNITY)
Admission: RE | Admit: 2016-09-17 | Discharge: 2016-09-17 | Disposition: A | Discharge status: Home / Self Care | Payer: BLUE CROSS/BLUE SHIELD | Source: Ambulatory Visit | Attending: Neurosurgery | Admitting: Neurosurgery

## 2016-10-13 ENCOUNTER — Ambulatory Visit (HOSPITAL_COMMUNITY)
Admission: RE | Admit: 2016-10-13 | Discharge: 2016-10-13 | Disposition: A | Discharge status: Home / Self Care | Payer: BLUE CROSS/BLUE SHIELD | Source: Ambulatory Visit | Attending: Neurosurgery | Admitting: Neurosurgery

## 2016-10-29 ENCOUNTER — Other Ambulatory Visit: Payer: Self-pay | Admitting: Neurosurgery

## 2016-10-29 ENCOUNTER — Ambulatory Visit (HOSPITAL_COMMUNITY)
Admission: RE | Admit: 2016-10-29 | Discharge: 2016-10-29 | Disposition: A | Discharge status: Home / Self Care | Payer: BLUE CROSS/BLUE SHIELD | Source: Ambulatory Visit | Attending: Neurosurgery | Admitting: Neurosurgery

## 2016-10-29 DIAGNOSIS — F17210 Nicotine dependence, cigarettes, uncomplicated: Secondary | ICD-10-CM | POA: Insufficient documentation

## 2016-10-29 DIAGNOSIS — I609 Nontraumatic subarachnoid hemorrhage, unspecified: Secondary | ICD-10-CM

## 2016-10-29 DIAGNOSIS — Z8774 Personal history of (corrected) congenital malformations of heart and circulatory system: Secondary | ICD-10-CM | POA: Insufficient documentation

## 2016-10-29 DIAGNOSIS — M549 Dorsalgia, unspecified: Secondary | ICD-10-CM | POA: Diagnosis not present

## 2016-10-29 DIAGNOSIS — Z48812 Encounter for surgical aftercare following surgery on the circulatory system: Secondary | ICD-10-CM | POA: Diagnosis not present

## 2016-10-29 DIAGNOSIS — G43909 Migraine, unspecified, not intractable, without status migrainosus: Secondary | ICD-10-CM | POA: Diagnosis not present

## 2016-10-29 DIAGNOSIS — R413 Other amnesia: Secondary | ICD-10-CM | POA: Insufficient documentation

## 2016-10-29 HISTORY — PX: IR ANGIO INTRA EXTRACRAN SEL INTERNAL CAROTID UNI R MOD SED: IMG5362

## 2016-10-29 LAB — URINALYSIS, COMPLETE (UACMP) WITH MICROSCOPIC
BACTERIA UA: NONE SEEN
BILIRUBIN URINE: NEGATIVE
GLUCOSE, UA: NEGATIVE mg/dL
KETONES UR: NEGATIVE mg/dL
Leukocytes, UA: NEGATIVE
NITRITE: NEGATIVE
PROTEIN: NEGATIVE mg/dL
Specific Gravity, Urine: 1.018 (ref 1.005–1.030)
Squamous Epithelial / LPF: NONE SEEN
pH: 6 (ref 5.0–8.0)

## 2016-10-29 LAB — BASIC METABOLIC PANEL
Anion gap: 8 (ref 5–15)
BUN: 9 mg/dL (ref 6–20)
CHLORIDE: 107 mmol/L (ref 101–111)
CO2: 26 mmol/L (ref 22–32)
CREATININE: 0.7 mg/dL (ref 0.44–1.00)
Calcium: 9.3 mg/dL (ref 8.9–10.3)
GFR calc Af Amer: >60 mL/min (ref >60)
GFR calc non Af Amer: >60 mL/min (ref >60)
Glucose, Bld: 101 mg/dL — ABNORMAL HIGH (ref 65–99)
Potassium: 3.9 mmol/L (ref 3.5–5.1)
SODIUM: 141 mmol/L (ref 135–145)

## 2016-10-29 LAB — CBC
HCT: 36.4 % (ref 36.0–46.0)
HEMOGLOBIN: 11.8 g/dL — AB (ref 12.0–15.0)
MCH: 30.4 pg (ref 26.0–34.0)
MCHC: 32.4 g/dL (ref 30.0–36.0)
MCV: 93.8 fL (ref 78.0–100.0)
PLATELETS: 296 10*3/uL (ref 150–400)
RBC: 3.88 MIL/uL (ref 3.87–5.11)
RDW: 12.8 % (ref 11.5–15.5)
WBC: 5.1 10*3/uL (ref 4.0–10.5)

## 2016-10-29 LAB — PROTIME-INR
INR: 0.99
Prothrombin Time: 13.1 seconds (ref 11.4–15.2)

## 2016-10-29 LAB — PREGNANCY, URINE: PREG TEST UR: NEGATIVE

## 2016-10-29 LAB — APTT: aPTT: 27 seconds (ref 24–36)

## 2016-10-29 MED ORDER — SODIUM CHLORIDE 0.9 % IV SOLN
INTRAVENOUS | Status: AC | PRN
Start: 2016-10-29 — End: 2016-10-29
  Administered 2016-10-29: 10 mL/h via INTRAVENOUS

## 2016-10-29 MED ORDER — LIDOCAINE HCL (PF) 1 % IJ SOLN
INTRAMUSCULAR | Status: AC
  Filled 2016-10-29: qty 30

## 2016-10-29 MED ORDER — SODIUM CHLORIDE 0.9 % IV SOLN
INTRAVENOUS | Status: DC
  Administered 2016-10-29: 16:00:00 via INTRAVENOUS

## 2016-10-29 MED ORDER — LIDOCAINE HCL 1 % IJ SOLN
INTRAMUSCULAR | Status: AC | PRN
  Administered 2016-10-29: 10 mL

## 2016-10-29 MED ORDER — HYDROCODONE-ACETAMINOPHEN 5-325 MG PO TABS
1.0000 | ORAL_TABLET | ORAL | Status: DC | PRN
  Administered 2016-10-29: 1 via ORAL

## 2016-10-29 MED ORDER — HYDROCODONE-ACETAMINOPHEN 5-325 MG PO TABS
ORAL_TABLET | ORAL | Status: AC
  Administered 2016-10-29: 1 via ORAL
  Filled 2016-10-29: qty 1

## 2016-10-29 MED ORDER — FENTANYL CITRATE (PF) 100 MCG/2ML IJ SOLN
INTRAMUSCULAR | Status: AC
  Filled 2016-10-29: qty 2

## 2016-10-29 MED ORDER — MIDAZOLAM HCL 2 MG/2ML IJ SOLN
INTRAMUSCULAR | Status: AC | PRN
  Administered 2016-10-29: 1 mg via INTRAVENOUS

## 2016-10-29 MED ORDER — HEPARIN SOD (PORK) LOCK FLUSH 100 UNIT/ML IV SOLN
INTRAVENOUS | Status: AC | PRN
  Administered 2016-10-29: 2000 [IU] via INTRAVENOUS

## 2016-10-29 MED ORDER — IOPAMIDOL (ISOVUE-300) INJECTION 61%
INTRAVENOUS | Status: AC
  Administered 2016-10-29: 15 mL
  Filled 2016-10-29: qty 100

## 2016-10-29 MED ORDER — FENTANYL CITRATE (PF) 100 MCG/2ML IJ SOLN
INTRAMUSCULAR | Status: AC | PRN
  Administered 2016-10-29: 25 ug via INTRAVENOUS

## 2016-10-29 MED ORDER — MIDAZOLAM HCL 2 MG/2ML IJ SOLN
INTRAMUSCULAR | Status: AC
  Filled 2016-10-29: qty 2

## 2016-10-29 MED ORDER — HEPARIN SOD (PORK) LOCK FLUSH 100 UNIT/ML IV SOLN
INTRAVENOUS | Status: AC
  Filled 2016-10-29: qty 30

## 2016-10-29 NOTE — Progress Notes (Signed)
Pt verbalized understanding of possible delay in procedure due to emergency in Radiology.

## 2016-10-29 NOTE — Sedation Documentation (Signed)
Patient denies pain and is resting comfortably.  

## 2016-10-29 NOTE — H&P (Signed)
CC:  History of aneurysm rupture  HPI: Ms. Morgan Castillo is a 56 year old woman I'm seeing in follow-up, approximately 12 months after undergoing clipping of ruptured right posterior communicating artery aneurysm.  At last office visit her main complaint was primarily short-term memory loss.  She reports this still continues to remain issue.  She does endorse some right-sided headaches, but this is chronic in nature.  She denies any changes in vision, facial droop, weakness stroke-like symptoms.    PMH: Past Medical History:  Diagnosis Date  . Back pain   . Migraines     PSH: Past Surgical History:  Procedure Laterality Date  . BACK SURGERY    . CESAREAN SECTION    . CRANIOTOMY Right 09/27/2015   Procedure: CRANIOTOMY INTRACRANIAL ANEURYSM FOR CAROTID;  Surgeon: Lisbeth RenshawNeelesh Jarman Litton, MD;  Location: [facility];  Service: Neurosurgery;  Laterality: Right;  . IR GENERIC HISTORICAL  09/27/2015   IR 3D INDEPENDENT WKST 09/27/2015 Lisbeth RenshawNeelesh Katlynn Naser, MD [facility]  . IR GENERIC HISTORICAL  09/27/2015   IR ANGIO VERTEBRAL SEL VERTEBRAL UNI L MOD SED 09/27/2015 Lisbeth RenshawNeelesh Delan Ksiazek, MD [facility]  . IR GENERIC HISTORICAL  09/27/2015   IR ANGIO INTRA EXTRACRAN SEL INTERNAL CAROTID BILAT MOD SED 09/27/2015 Lisbeth RenshawNeelesh Eily Louvier, MD [facility]    SH: Social History  Substance Use Topics  . Smoking status: Current Every Day Smoker    Packs/day: 0.25    Years: 20.00    Types: Cigarettes  . Smokeless tobacco: Never Used  . Alcohol use 1.8 oz/week    3 Shots of liquor per week     Comment: occ    MEDS: Prior to Admission medications   Medication Sig Start Date End Date Taking? Authorizing Provider  HYDROcodone-acetaminophen (NORCO) 10-325 MG tablet Take 1 tablet by mouth every 6 (six) hours as needed for moderate pain. 10/02/15   Lisbeth RenshawNundkumar, Allsion Nogales, MD  levETIRAcetam (KEPPRA) 500 MG tablet Take 1 tablet (500 mg total) by mouth 2 (two) times daily. 10/02/15   Lisbeth RenshawNundkumar, Tatumn Corbridge, MD  silver  sulfADIAZINE (SILVADENE) 1 % cream Apply 1 application topically daily as needed (eczema).    [provider]    ALLERGY: Allergies  Allergen Reactions  . Motrin [Ibuprofen] Nausea And Vomiting    ROS: ROS  NEUROLOGIC EXAM: Awake, alert, oriented Memory and concentration grossly intact Speech fluent, appropriate CN grossly intact Motor exam: Upper Extremities Deltoid Bicep Tricep Grip  Right 5/5 5/5 5/5 5/5  Left 5/5 5/5 5/5 5/5   Lower Extremity IP Quad PF DF EHL  Right 5/5 5/5 5/5 5/5 5/5  Left 5/5 5/5 5/5 5/5 5/5   Sensation grossly intact to LT  IMPRESSION: 56 year old woman about 12 months status post subarachnoid hemorrhage and clipping of a right posterior communicating artery aneurysm.    PLAN: - will proceed with diagnostic cerebral angiogram   I have reviewed the indications, risks, benefits, and alternatives to the angiogram with the patient in the office. All questions were answered.a

## 2016-10-29 NOTE — Discharge Instructions (Signed)
Angiogram, Care After °This sheet gives you information about how to care for yourself after your procedure. Your health care provider may also give you more specific instructions. If you have problems or questions, contact your health care provider. °What can I expect after the procedure? °After the procedure, it is common to have bruising and tenderness at the catheter insertion area. °Follow these instructions at home: °Insertion site care  °· Follow instructions from your health care provider about how to take care of your insertion site. Make sure you: °¨ Wash your hands with soap and water before you change your bandage (dressing). If soap and water are not available, use hand sanitizer. °¨ Change your dressing as told by your health care provider. °¨ Leave stitches (sutures), skin glue, or adhesive strips in place. These skin closures may need to stay in place for 2 weeks or longer. If adhesive strip edges start to loosen and curl up, you may trim the loose edges. Do not remove adhesive strips completely unless your health care provider tells you to do that. °· Do not take baths, swim, or use a hot tub until your health care provider approves. °· You may shower 24-48 hours after the procedure or as told by your health care provider. °¨ Gently wash the site with plain soap and water. °¨ Pat the area dry with a clean towel. °¨ Do not rub the site. This may cause bleeding. °· Do not apply powder or lotion to the site. Keep the site clean and dry. °· Check your insertion site every day for signs of infection. Check for: °¨ Redness, swelling, or pain. °¨ Fluid or blood. °¨ Warmth. °¨ Pus or a bad smell. °Activity  °· Rest as told by your health care provider, usually for 1-2 days. °· Do not lift anything that is heavier than 10 lbs. (4.5 kg) or as told by your health care provider. °· Do not drive for 24 hours if you were given a medicine to help you relax (sedative). °· Do not drive or use heavy machinery while  taking prescription pain medicine. °General instructions  °· Return to your normal activities as told by your health care provider, usually in about a week. Ask your health care provider what activities are safe for you. °· If the catheter site starts bleeding, lie flat and put pressure on the site. If the bleeding does not stop, get help right away. This is a medical emergency. °· Drink enough fluid to keep your urine clear or pale yellow. This helps flush the contrast dye from your body. °· Take over-the-counter and prescription medicines only as told by your health care provider. °· Keep all follow-up visits as told by your health care provider. This is important. °Contact a health care provider if: °· You have a fever or chills. °· You have redness, swelling, or pain around your insertion site. °· You have fluid or blood coming from your insertion site. °· The insertion site feels warm to the touch. °· You have pus or a bad smell coming from your insertion site. °· You have bruising around the insertion site. °· You notice blood collecting in the tissue around the catheter site (hematoma). The hematoma may be painful to the touch. °Get help right away if: °· You have severe pain at the catheter insertion area. °· The catheter insertion area swells very fast. °· The catheter insertion area is bleeding, and the bleeding does not stop when you hold steady pressure on   the area. °· The area near or just beyond the catheter insertion site becomes pale, cool, tingly, or numb. °These symptoms may represent a serious problem that is an emergency. Do not wait to see if the symptoms will go away. Get medical help right away. Call your local emergency services (911 in the [place] Do not drive yourself to the hospital. °Summary °· After the procedure, it is common to have bruising and tenderness at the catheter insertion area. °· After the procedure, it is important to rest and drink plenty of fluids. °· Do not take baths,  swim, or use a hot tub until your health care provider says it is okay to do so. You may shower 24-48 hours after the procedure or as told by your health care provider. °· If the catheter site starts bleeding, lie flat and put pressure on the site. If the bleeding does not stop, get help right away. This is a medical emergency. °This information is not intended to replace advice given to you by your health care provider. Make sure you discuss any questions you have with your health care provider. °Document Released: 10/09/2004 Document Revised: 02/26/2016 Document Reviewed: 02/26/2016 °Elsevier Interactive Patient Education © 2017 [employer] ° °

## 2016-10-29 NOTE — Sedation Documentation (Signed)
Patient is resting comfortably. 

## 2016-11-03 ENCOUNTER — Encounter (HOSPITAL_COMMUNITY): Payer: Self-pay | Admitting: Neurosurgery

## 2016-11-06 ENCOUNTER — Emergency Department (HOSPITAL_BASED_OUTPATIENT_CLINIC_OR_DEPARTMENT_OTHER): Payer: BLUE CROSS/BLUE SHIELD

## 2016-11-06 ENCOUNTER — Emergency Department (HOSPITAL_BASED_OUTPATIENT_CLINIC_OR_DEPARTMENT_OTHER)
Admission: EM | Admit: 2016-11-06 | Discharge: 2016-11-06 | Disposition: A | Discharge status: Home / Self Care | Payer: BLUE CROSS/BLUE SHIELD | Attending: Emergency Medicine | Admitting: Emergency Medicine

## 2016-11-06 ENCOUNTER — Encounter (HOSPITAL_BASED_OUTPATIENT_CLINIC_OR_DEPARTMENT_OTHER): Payer: Self-pay | Admitting: *Deleted

## 2016-11-06 DIAGNOSIS — F17210 Nicotine dependence, cigarettes, uncomplicated: Secondary | ICD-10-CM | POA: Diagnosis not present

## 2016-11-06 DIAGNOSIS — R600 Localized edema: Secondary | ICD-10-CM | POA: Insufficient documentation

## 2016-11-06 DIAGNOSIS — Z79899 Other long term (current) drug therapy: Secondary | ICD-10-CM | POA: Diagnosis not present

## 2016-11-06 DIAGNOSIS — I872 Venous insufficiency (chronic) (peripheral): Secondary | ICD-10-CM

## 2016-11-06 DIAGNOSIS — R609 Edema, unspecified: Secondary | ICD-10-CM

## 2016-11-06 DIAGNOSIS — R2243 Localized swelling, mass and lump, lower limb, bilateral: Secondary | ICD-10-CM | POA: Diagnosis present

## 2016-11-06 LAB — COMPREHENSIVE METABOLIC PANEL
ALT: 32 U/L (ref 14–54)
AST: 30 U/L (ref 15–41)
Albumin: 4.2 g/dL (ref 3.5–5.0)
Alkaline Phosphatase: 78 U/L (ref 38–126)
Anion gap: 7 (ref 5–15)
BUN: 11 mg/dL (ref 6–20)
CHLORIDE: 104 mmol/L (ref 101–111)
CO2: 27 mmol/L (ref 22–32)
CREATININE: 0.64 mg/dL (ref 0.44–1.00)
Calcium: 9.3 mg/dL (ref 8.9–10.3)
GFR calc Af Amer: >60 mL/min (ref >60)
Glucose, Bld: 99 mg/dL (ref 65–99)
Potassium: 4.1 mmol/L (ref 3.5–5.1)
SODIUM: 138 mmol/L (ref 135–145)
Total Bilirubin: 0.5 mg/dL (ref 0.3–1.2)
Total Protein: 7.5 g/dL (ref 6.5–8.1)

## 2016-11-06 LAB — CBC WITH DIFFERENTIAL/PLATELET
BASOS PCT: 0 %
Basophils Absolute: 0 10*3/uL (ref 0.0–0.1)
EOS ABS: 0.1 10*3/uL (ref 0.0–0.7)
Eosinophils Relative: 1 %
HCT: 36 % (ref 36.0–46.0)
HEMOGLOBIN: 11.9 g/dL — AB (ref 12.0–15.0)
Lymphocytes Relative: 55 %
Lymphs Abs: 2.7 10*3/uL (ref 0.7–4.0)
MCH: 31.9 pg (ref 26.0–34.0)
MCHC: 33.1 g/dL (ref 30.0–36.0)
MCV: 96.5 fL (ref 78.0–100.0)
Monocytes Absolute: 0.7 10*3/uL (ref 0.1–1.0)
Monocytes Relative: 13 %
NEUTROS PCT: 31 %
Neutro Abs: 1.5 10*3/uL — ABNORMAL LOW (ref 1.7–7.7)
PLATELETS: 295 10*3/uL (ref 150–400)
RBC: 3.73 MIL/uL — AB (ref 3.87–5.11)
RDW: 12.6 % (ref 11.5–15.5)
WBC: 4.9 10*3/uL (ref 4.0–10.5)

## 2016-11-06 LAB — PREGNANCY, URINE: Preg Test, Ur: NEGATIVE

## 2016-11-06 LAB — TROPONIN I: Troponin I: <0.03 ng/mL (ref <0.03)

## 2016-11-06 LAB — BRAIN NATRIURETIC PEPTIDE: B Natriuretic Peptide: 14.4 pg/mL (ref 0.0–100.0)

## 2016-11-06 LAB — D-DIMER, QUANTITATIVE: D-Dimer, Quant: 0.38 ug/mL-FEU (ref 0.00–0.50)

## 2016-11-06 MED ORDER — MEDICAL COMPRESSION STOCKINGS MISC: 2.0000 | Freq: Every day | 0 refills | Status: AC

## 2016-11-06 MED ORDER — POTASSIUM CHLORIDE CRYS ER 20 MEQ PO TBCR: 20.0000 meq | EXTENDED_RELEASE_TABLET | Freq: Every day | ORAL | 0 refills | Status: AC

## 2016-11-06 MED ORDER — FUROSEMIDE 20 MG PO TABS: 20.0000 mg | ORAL_TABLET | Freq: Every day | ORAL | 0 refills | Status: AC

## 2016-11-06 NOTE — ED Provider Notes (Signed)
[facility] Provider Note   CSN: [id number] Arrival date & time: 11/06/16  1447     History   Chief Complaint Chief Complaint  Patient presents with  . Leg Swelling    bil    HPI Morgan Castillo is a 56 y.o. female.  HPI   56 year old female here with bilateral leg swelling. The patient states of the last month, she has had significant increased swelling in her bilateral legs. This occasionally improves when she sleeps at night and raises her legs but then returns. She endorses associated aching, throbbing pain in her bilateral legs. This has happened to her in the past and she is responded to Lasix. Denies known history of heart failure or liver disease. No history of kidney disease. No history of DVTs or clots but she does smoke. The symptoms have persisted for longer than usual. They did begin after she traveled to [place], though she states that anytime she sits in a car for a long amount of time, she does have significant leg swelling but usually resolves. Denies any chest pain or shortness of breath. No hemoptysis.  Past Medical History:  Diagnosis Date  . Back pain   . Migraines     Patient Active Problem List   Diagnosis Date Noted  . Subarachnoid bleed (HCC) 09/26/2015    Past Surgical History:  Procedure Laterality Date  . BACK SURGERY    . CESAREAN SECTION    . CRANIOTOMY Right 09/27/2015   Procedure: CRANIOTOMY INTRACRANIAL ANEURYSM FOR CAROTID;  Surgeon: Lisbeth Renshaw, MD;  Location: [facility];  Service: Neurosurgery;  Laterality: Right;  . IR ANGIO INTRA EXTRACRAN SEL INTERNAL CAROTID UNI R MOD SED  10/29/2016  . IR GENERIC HISTORICAL  09/27/2015   IR 3D INDEPENDENT WKST 09/27/2015 Lisbeth Renshaw, MD [facility]  . IR GENERIC HISTORICAL  09/27/2015   IR ANGIO VERTEBRAL SEL VERTEBRAL UNI L MOD SED 09/27/2015 Lisbeth Renshaw, MD [facility]  . IR GENERIC HISTORICAL  09/27/2015   IR ANGIO INTRA EXTRACRAN SEL INTERNAL CAROTID BILAT MOD  SED 09/27/2015 Lisbeth Renshaw, MD [facility]    OB History    No data available       Home Medications    Prior to Admission medications   Medication Sig Start Date End Date Taking? Authorizing Provider  Elastic Bandages & Supports (MEDICAL COMPRESSION STOCKINGS) MISC 2 each by Does not apply route daily. 11/06/16   Shaune Pollack, MD  furosemide (LASIX) 20 MG tablet Take 1 tablet (20 mg total) by mouth daily. 11/06/16 11/11/16  Shaune Pollack, MD  HYDROcodone-acetaminophen (NORCO) 10-325 MG tablet Take 1 tablet by mouth every 6 (six) hours as needed for moderate pain. 10/02/15   Lisbeth Renshaw, MD  levETIRAcetam (KEPPRA) 500 MG tablet Take 1 tablet (500 mg total) by mouth 2 (two) times daily. 10/02/15   Lisbeth Renshaw, MD  potassium chloride SA (K-DUR,KLOR-CON) 20 MEQ tablet Take 1 tablet (20 mEq total) by mouth daily. 11/06/16   Shaune Pollack, MD  silver sulfADIAZINE (SILVADENE) 1 % cream Apply 1 application topically daily as needed (eczema).    [provider]    Family History History reviewed. No pertinent family history.  Social History Social History  Substance Use Topics  . Smoking status: Current Every Day Smoker    Packs/day: 0.25    Years: 20.00    Types: Cigarettes  . Smokeless tobacco: Never Used  . Alcohol use 1.8 oz/week    3 Shots of liquor per  week     Comment: occ     Allergies   Motrin [ibuprofen]   Review of Systems Review of Systems  Constitutional: Negative for chills, fatigue and fever.  HENT: Negative for congestion, rhinorrhea and sore throat.   Eyes: Negative for visual disturbance.  Respiratory: Negative for cough, shortness of breath and wheezing.   Cardiovascular: Positive for leg swelling. Negative for chest pain.  Gastrointestinal: Negative for abdominal pain, diarrhea, nausea and vomiting.  Genitourinary: Negative for dysuria, flank pain, vaginal bleeding and vaginal discharge.  Musculoskeletal: Positive for  arthralgias. Negative for neck pain.  Skin: Negative for rash.  Allergic/Immunologic: Negative for immunocompromised state.  Neurological: Negative for syncope and headaches.  Hematological: Does not bruise/bleed easily.  All other systems reviewed and are negative.    Physical Exam Updated Vital Signs BP (!) 152/85 (BP Location: Left Arm)   Pulse 98   Temp 98.6 F (37 C) (Oral)   Resp 16   Ht 5\' 6"  (1.676 m)   Wt 72.6 kg (160 lb)   LMP 06/29/2016 (Approximate)   SpO2 98%   BMI 25.82 kg/m   Physical Exam  Constitutional: She is oriented to person, place, and time. She appears well-developed and well-nourished. No distress.  HENT:  Head: Normocephalic and atraumatic.  Mouth/Throat: Oropharynx is clear and moist.  Eyes: Conjunctivae are normal.  Neck: Neck supple.  Cardiovascular: Normal rate, regular rhythm and normal heart sounds.  Exam reveals no friction rub.   No murmur heard. Pulmonary/Chest: Effort normal and breath sounds normal. No respiratory distress. She has no wheezes. She has no rales.  Abdominal: She exhibits no distension.  Musculoskeletal: She exhibits no edema (Bilateral 1+ pitting edema to lower extremities. No popliteal tenderness or fullness. No palpable cords.).  Neurological: She is alert and oriented to person, place, and time. She exhibits normal muscle tone.  Skin: Skin is warm. Capillary refill takes less than 2 seconds.  Psychiatric: She has a normal mood and affect.  Nursing note and vitals reviewed.    ED Treatments / Results  Labs (all labs ordered are listed, but only abnormal results are displayed) Labs Reviewed  CBC WITH DIFFERENTIAL/PLATELET - Abnormal; Notable for the following:       Result Value   RBC 3.73 (*)    Hemoglobin 11.9 (*)    Neutro Abs 1.5 (*)    All other components within normal limits  COMPREHENSIVE METABOLIC PANEL  BRAIN NATRIURETIC PEPTIDE  TROPONIN I  PREGNANCY, URINE  D-DIMER, QUANTITATIVE (NOT AT [facility])     EKG  EKG Interpretation  Date/Time:  Friday November 06 2016 15:53:09 EDT Ventricular Rate:  90 PR Interval:    QRS Duration: 89 QT Interval:  402 QTC Calculation: 492 R Axis:   7 Text Interpretation:  Sinus rhythm Ventricular premature complex Aberrant conduction of SV complex(es) Consider left atrial enlargement Low voltage, precordial leads Borderline prolonged QT interval Since last EKG, there are non-specific, diffuse ST-t changes without overt ST elevations or depressions Confirmed by Shaune PollackIsaacs, Levante Simones [phone]([id number]) on 11/07/2016 1:53:18 AM       Radiology Dg Chest 2 View  Result Date: 11/06/2016 CLINICAL DATA:  Pt stated no SOB, no chest pain; swelling in lower legs (US today) EXAM: CHEST  2 VIEW COMPARISON:  09/27/2015 FINDINGS: Left IJ central line has been removed. The heart is mildly enlarged. There are no focal consolidations or pleural effusions. No pulmonary edema. IMPRESSION: Mild cardiomegaly.  No evidence for acute pulmonary abnormality. Electronically Signed  By: Norva PavlovElizabeth  Brown M.D.   On: 11/06/2016 17:41   [place] Venous Img Lower Bilateral  Result Date: 11/06/2016 CLINICAL DATA:  Bilateral lower leg swelling and pain for 1 month. History of tobacco use. EXAM: BILATERAL LOWER EXTREMITY VENOUS DOPPLER ULTRASOUND TECHNIQUE: Gray-scale sonography with graded compression, as well as color Doppler and duplex ultrasound were performed to evaluate the lower extremity deep venous systems from the level of the common femoral vein and including the common femoral, femoral, profunda femoral, popliteal and calf veins including the posterior tibial, peroneal and gastrocnemius veins when visible. The superficial great saphenous vein was also interrogated. Spectral Doppler was utilized to evaluate flow at rest and with distal augmentation maneuvers in the common femoral, femoral and popliteal veins. COMPARISON:  None FINDINGS: RIGHT LOWER EXTREMITY Common Femoral Vein: No evidence of thrombus. Normal  compressibility, respiratory phasicity and response to augmentation. Saphenofemoral Junction: No evidence of thrombus. Normal compressibility and flow on color Doppler imaging. Profunda Femoral Vein: No evidence of thrombus. Normal compressibility and flow on color Doppler imaging. Femoral Vein: No evidence of thrombus. Normal compressibility, respiratory phasicity and response to augmentation. Popliteal Vein: No evidence of thrombus. Normal compressibility, respiratory phasicity and response to augmentation. Calf Veins: Limited visualization.  No thrombus detected. Superficial Great Saphenous Vein: No evidence of thrombus. Normal compressibility and flow on color Doppler imaging. Venous Reflux:  None. Other Findings:  None. LEFT LOWER EXTREMITY Common Femoral Vein: No evidence of thrombus. Normal compressibility, respiratory phasicity and response to augmentation. Saphenofemoral Junction: No evidence of thrombus. Normal compressibility and flow on color Doppler imaging. Profunda Femoral Vein: No evidence of thrombus. Normal compressibility and flow on color Doppler imaging. Femoral Vein: No evidence of thrombus. Normal compressibility, respiratory phasicity and response to augmentation. Popliteal Vein: No evidence of thrombus. Normal compressibility, respiratory phasicity and response to augmentation. Calf Veins: Limited visualization.  No thrombus detected. Superficial Great Saphenous Vein: No evidence of thrombus. Normal compressibility and flow on color Doppler imaging. Venous Reflux:  None. Other Findings:  None. IMPRESSION: No evidence of DVT within either lower extremity. Electronically Signed   By: Norva PavlovElizabeth  Brown M.D.   On: 11/06/2016 17:40    Procedures Procedures (including critical care time)  Medications Ordered in ED Medications - No data to display   Initial Impression / Assessment and Plan / ED Course  I have reviewed the triage vital signs and the nursing notes.  Pertinent labs &  imaging results that were available during my care of the patient were reviewed by me and considered in my medical decision making (see chart for details).  Clinical Course as of Nov 08 154  Sat Nov 07, 2016  0153 CO2: 27 [CI]    Clinical Course User Index [CI] Shaune PollackIsaacs, Kimberely Mccannon, MD    56 yo F with PMHx as above here wit bilateral leg pain/swelling. DDx is broad and imaging, lab work as above. Bilateral U/S negative with negative D-Dimer - do not suspect DVT. Pt has fully intact distal perfusion, pulses w/o evidence to suggest PAD/ischemia. Albumin, LFTs normal w/o signs of liver failure. Renal function is normal. BNP normal but pt does have some non-specific EKG changes, cannot r/o underlying mild dCHF but no acute CP, SOB, or s/s to suggest ACS. CXR is clear. Doubt PE in setting of normal D-Dimer and neg U/S.   Per review of records, pt has had similar sx in past with improvement on lasix. Will give a short course, compression stockings, and advise PCP f/u for echo,  further eval. Pt in agreement.  Final Clinical Impressions(s) / ED Diagnoses   Final diagnoses:  Peripheral edema  Venous insufficiency    New Prescriptions Discharge Medication List as of 11/06/2016  6:29 PM    START taking these medications   Details  Elastic Bandages & Supports (MEDICAL COMPRESSION STOCKINGS) MISC 2 each by Does not apply route daily., Starting Fri 11/06/2016, Print    furosemide (LASIX) 20 MG tablet Take 1 tablet (20 mg total) by mouth daily., Starting Fri 11/06/2016, Until Wed 11/11/2016, Print    potassium chloride SA (K-DUR,KLOR-CON) 20 MEQ tablet Take 1 tablet (20 mEq total) by mouth daily., Starting Fri 11/06/2016, Print         Shaune Pollack, MD 11/07/16 [phone]

## 2016-11-06 NOTE — Discharge Instructions (Signed)
-   Wear COMPRESSION STOCKINGS daily. These can be purchased at a medical supply store or drug store. Ask the pharmacist. - Keep your feet ELEVATED above the level of your heart as often as possible - Avoid foods that are high in SALT, or SODIUM

## 2016-11-06 NOTE — ED Notes (Signed)
ED Provider at bedside. 

## 2016-11-06 NOTE — ED Triage Notes (Signed)
Pt c/o bil leg swelling x 1 month

## 2016-11-06 NOTE — ED Notes (Signed)
Pt in [place] at this time 

## 2016-11-27 ENCOUNTER — Other Ambulatory Visit: Payer: Self-pay

## 2016-11-27 DIAGNOSIS — R5381 Other malaise: Secondary | ICD-10-CM

## 2021-08-25 ENCOUNTER — Other Ambulatory Visit: Payer: Self-pay | Admitting: Neurosurgery

## 2021-08-25 DIAGNOSIS — I609 Nontraumatic subarachnoid hemorrhage, unspecified: Secondary | ICD-10-CM

## 2021-09-10 ENCOUNTER — Other Ambulatory Visit: Payer: BLUE CROSS/BLUE SHIELD

## 2021-09-16 ENCOUNTER — Other Ambulatory Visit (HOSPITAL_COMMUNITY): Payer: Self-pay | Admitting: *Deleted

## 2021-09-16 DIAGNOSIS — R079 Chest pain, unspecified: Secondary | ICD-10-CM

## 2021-09-20 ENCOUNTER — Ambulatory Visit
Admission: RE | Admit: 2021-09-20 | Discharge: 2021-09-20 | Disposition: A | Discharge status: Home / Self Care | Payer: Medicare Other | Source: Ambulatory Visit | Attending: Neurosurgery | Admitting: Neurosurgery

## 2021-09-20 DIAGNOSIS — I609 Nontraumatic subarachnoid hemorrhage, unspecified: Secondary | ICD-10-CM

## 2021-10-06 ENCOUNTER — Other Ambulatory Visit (HOSPITAL_COMMUNITY): Payer: Self-pay | Admitting: *Deleted

## 2021-10-06 ENCOUNTER — Telehealth (HOSPITAL_COMMUNITY): Payer: Self-pay | Admitting: *Deleted

## 2021-10-06 MED ORDER — METOPROLOL TARTRATE 100 MG PO TABS: ORAL_TABLET | ORAL | 0 refills | Status: AC

## 2021-10-06 NOTE — Telephone Encounter (Signed)
Attempted to call patient regarding upcoming cardiac CT appointment. °Left message on voicemail with name and callback number ° °Ramiz Turpin RN Navigator Cardiac Imaging °[facility] °[phone] Office °[phone] Cell ° °

## 2021-10-06 NOTE — Telephone Encounter (Signed)
Reaching out to patient to offer assistance regarding upcoming cardiac imaging study; pt verbalizes understanding of appt date/time, parking situation and where to check in, pre-test NPO status and medications ordered, and verified current allergies; name and call back number provided for further questions should they arise  Atreyu Mak RN Navigator Cardiac Imaging [facility] [phone] office [phone] cell  Patient to take 100mg metoprolol tartrate two hours prior to her cardiac CT scan. She is aware to arrive at 3:30pm. 

## 2021-10-08 ENCOUNTER — Ambulatory Visit (HOSPITAL_COMMUNITY)
Admission: RE | Admit: 2021-10-08 | Discharge: 2021-10-08 | Disposition: A | Discharge status: Home / Self Care | Payer: Medicare Other | Source: Ambulatory Visit | Attending: Family Medicine | Admitting: Family Medicine

## 2021-10-08 DIAGNOSIS — J9811 Atelectasis: Secondary | ICD-10-CM | POA: Diagnosis not present

## 2021-10-08 DIAGNOSIS — I10 Essential (primary) hypertension: Secondary | ICD-10-CM | POA: Diagnosis not present

## 2021-10-08 DIAGNOSIS — K7689 Other specified diseases of liver: Secondary | ICD-10-CM | POA: Insufficient documentation

## 2021-10-08 DIAGNOSIS — Q2112 Patent foramen ovale: Secondary | ICD-10-CM | POA: Insufficient documentation

## 2021-10-08 DIAGNOSIS — R079 Chest pain, unspecified: Secondary | ICD-10-CM | POA: Diagnosis not present

## 2021-10-08 DIAGNOSIS — Q245 Malformation of coronary vessels: Secondary | ICD-10-CM | POA: Diagnosis not present

## 2021-10-08 DIAGNOSIS — R0789 Other chest pain: Secondary | ICD-10-CM | POA: Diagnosis not present

## 2021-10-08 MED ORDER — IOHEXOL 350 MG/ML SOLN
100.0000 mL | Freq: Once | INTRAVENOUS | Status: AC | PRN
  Administered 2021-10-08: 100 mL via INTRAVENOUS

## 2021-10-08 MED ORDER — NITROGLYCERIN 0.4 MG SL SUBL
0.8000 mg | SUBLINGUAL_TABLET | Freq: Once | SUBLINGUAL | Status: AC
  Administered 2021-10-08: 0.8 mg via SUBLINGUAL

## 2021-10-08 MED ORDER — NITROGLYCERIN 0.4 MG SL SUBL
SUBLINGUAL_TABLET | SUBLINGUAL | Status: AC
  Filled 2021-10-08: qty 2

## 2023-06-16 ENCOUNTER — Other Ambulatory Visit: Payer: Self-pay

## 2023-06-16 ENCOUNTER — Encounter (HOSPITAL_BASED_OUTPATIENT_CLINIC_OR_DEPARTMENT_OTHER): Payer: Self-pay | Admitting: Emergency Medicine

## 2023-06-16 ENCOUNTER — Emergency Department (HOSPITAL_BASED_OUTPATIENT_CLINIC_OR_DEPARTMENT_OTHER)

## 2023-06-16 ENCOUNTER — Emergency Department (HOSPITAL_BASED_OUTPATIENT_CLINIC_OR_DEPARTMENT_OTHER)
Admission: EM | Admit: 2023-06-16 | Discharge: 2023-06-16 | Disposition: A | Discharge status: Home / Self Care | Attending: Emergency Medicine | Admitting: Emergency Medicine

## 2023-06-16 DIAGNOSIS — Y92410 Unspecified street and highway as the place of occurrence of the external cause: Secondary | ICD-10-CM | POA: Diagnosis not present

## 2023-06-16 DIAGNOSIS — M791 Myalgia, unspecified site: Secondary | ICD-10-CM | POA: Diagnosis present

## 2023-06-16 MED ORDER — METHOCARBAMOL 500 MG PO TABS: 500.0000 mg | ORAL_TABLET | Freq: Two times a day (BID) | ORAL | 0 refills | Status: AC
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
## 2024-05-04 IMAGING — MR MR MRA HEAD W/O CM
1 series · 10 of 48 positions shown · non-contrast
Comparison: MRA head 09/20/2018.  MRA head 09/26/2015.

CLINICAL DATA: Provided history: Nontraumatic subarachnoid
hemorrhage, unspecified. Additional history provided by scanning
technologist: Patient reports headaches and left-sided
numbness/weakness for 6 years. Aneurysm clip placed in 0906.

EXAM:
MRA HEAD WITHOUT CONTRAST
TECHNIQUE: Angiographic images of the Circle of Willis were acquired using MRA
technique without intravenous contrast.

[Series 5: [id number] · axial · 0.6mm · 0.26mm/px · z∈[-40,+30]mm · 10 of 149 slices shown]
[im 10/149]
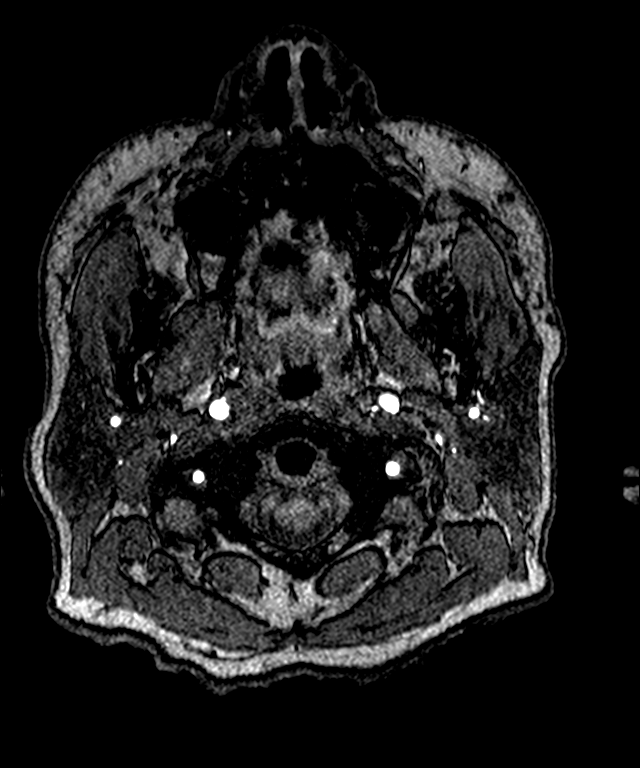
[im 26/149]
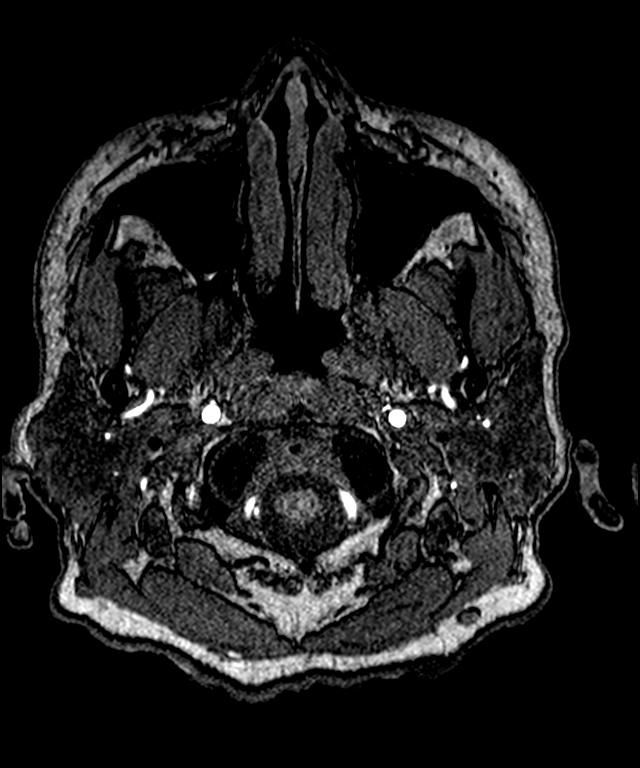
[im 29/149]
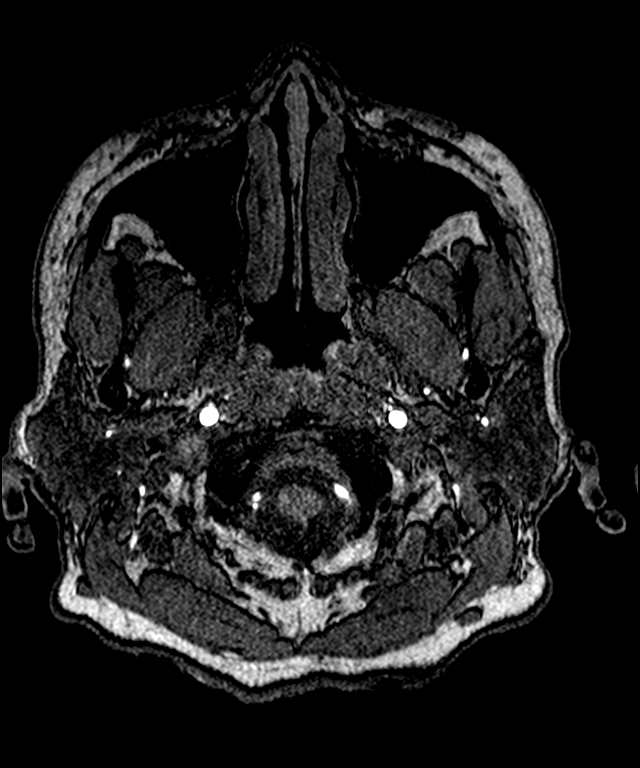
[im 48/149]
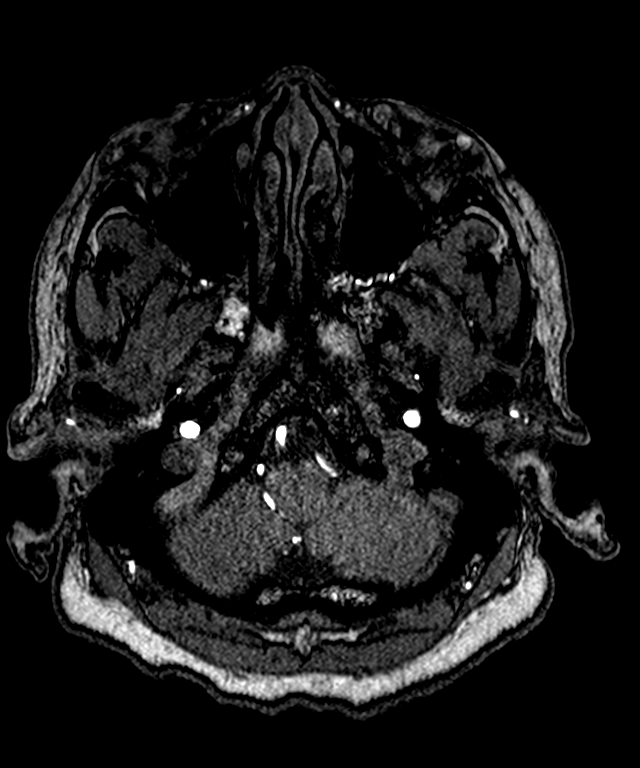
[im 67/149]
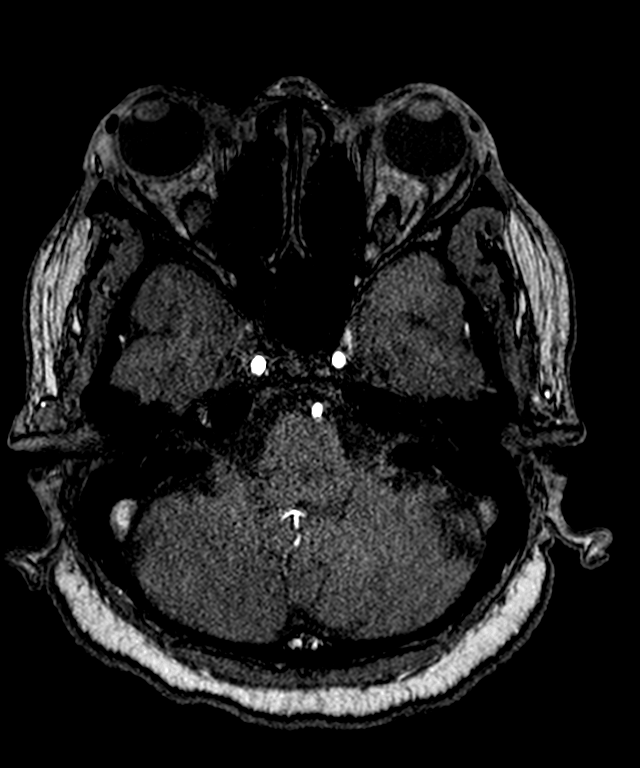
[im 76/149]
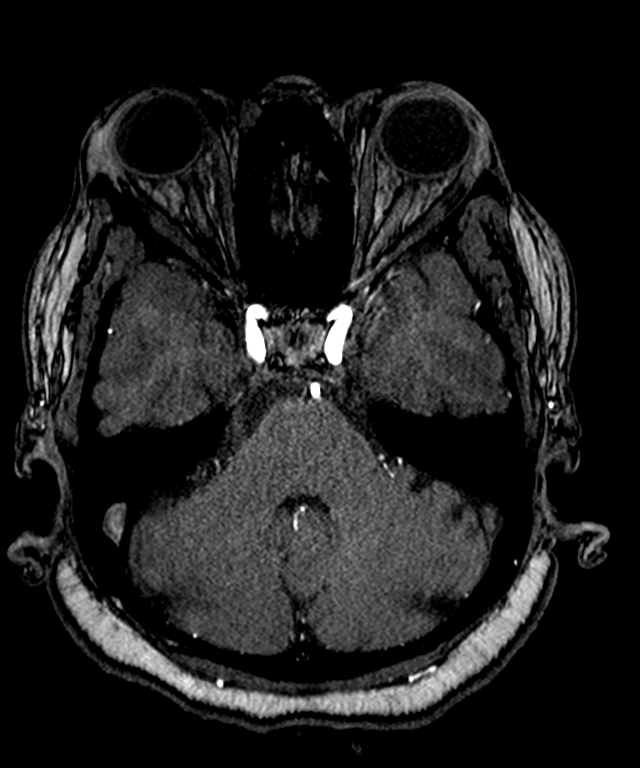
[im 86/149]
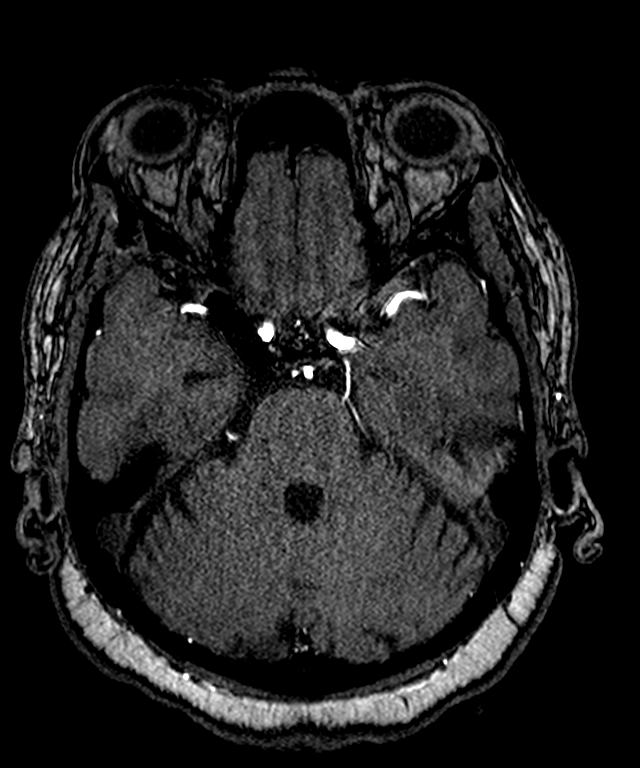
[im 104/149]
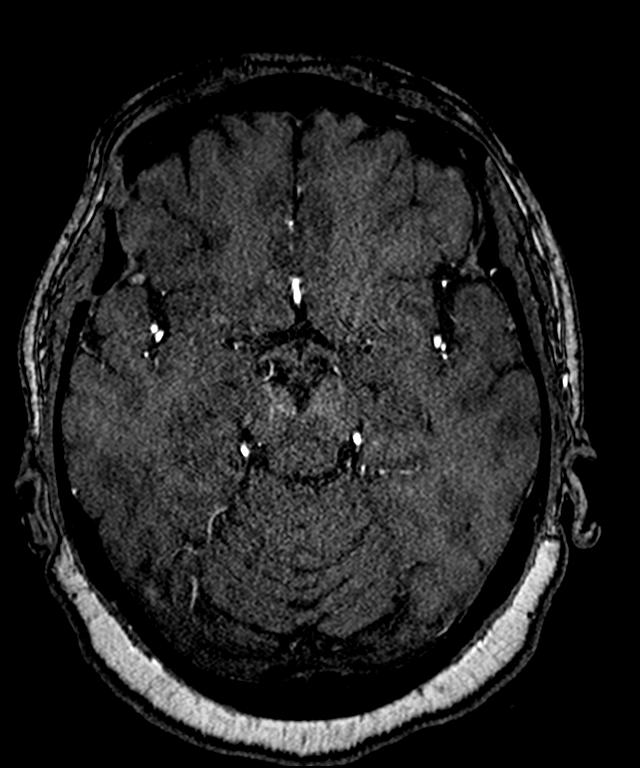
[im 123/149]
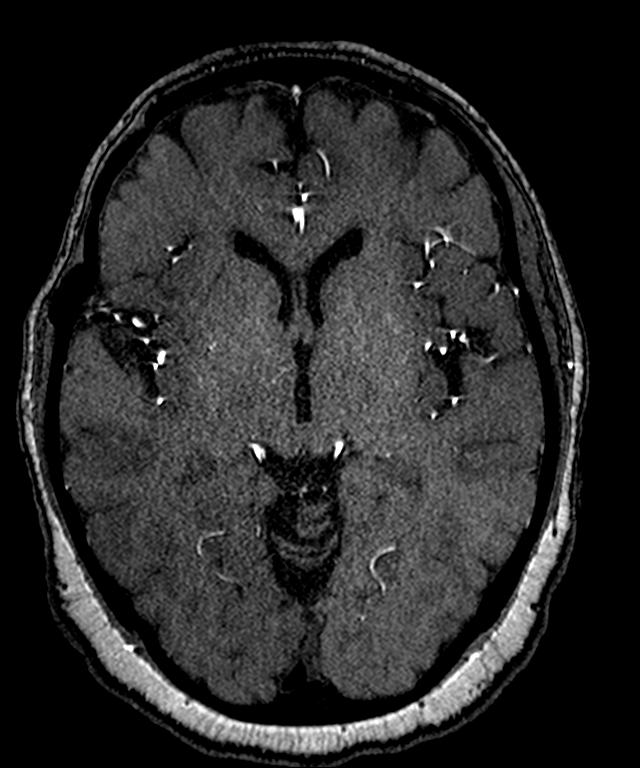
[im 126/149]
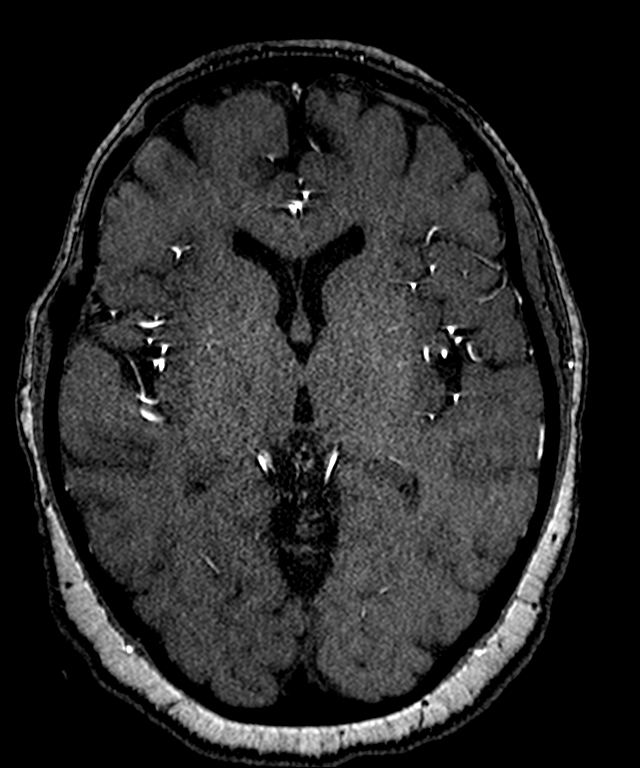

[10 of 48 positions shown; findings below may reference images not displayed]

FINDINGS: Anterior circulation:

Susceptibility artifact arising from an aneurysm clip in the region
of the right posterior communicating artery. Within this limitation,
there is no appreciable residual/recurrent right posterior
communicating artery aneurysm.

The intracranial internal carotid arteries are patent. The M1 middle
cerebral arteries are patent. No M2 proximal branch occlusion or
high-grade proximal stenosis. The anterior cerebral arteries are
patent. Susceptibility artifact arising from the aneurysm clip
partially obscures the supraclinoid right ICA, the proximal M1 right
MCA and the proximal aspect of an inferior division M2 right MCA
vessel. Within this limitation, no high-grade proximal stenosis is
identified within the anterior circulation

Posterior circulation:

The intracranial vertebral arteries are patent. The basilar artery
is patent. The posterior cerebral arteries are patent. The right
posterior cerebral artery is predominantly fetal in origin, there is
a developmentally hypoplastic right P1 segment. Susceptibility
artifact arising from the aneurysm clip obscures the right posterior
communicating artery origin. The left posterior communicating artery
is diminutive or absent.

Anatomic variants: As described.
IMPRESSION: Susceptibility artifact arising from an aneurysm clip in the region
of the right posterior communicating artery. Within this limitation,
there is no appreciable residual/recurrent right posterior
communicating artery aneurysm.

The right posterior cerebral artery is predominantly fetal in origin
and is patent.
